# Patient Record
Sex: Female | Born: 1945 | Race: White | Hispanic: No | Marital: Married | Smoking: Former smoker
Health system: Southern US, Community
[De-identification: ages and names within clinical notes are randomized; demographics above are authoritative.]

## PROBLEM LIST (undated history)

## (undated) DIAGNOSIS — I4821 Permanent atrial fibrillation: Secondary | ICD-10-CM

## (undated) DIAGNOSIS — T8209XA Other mechanical complication of heart valve prosthesis, initial encounter: Secondary | ICD-10-CM

## (undated) DIAGNOSIS — Z7901 Long term (current) use of anticoagulants: Secondary | ICD-10-CM

## (undated) DIAGNOSIS — I059 Rheumatic mitral valve disease, unspecified: Secondary | ICD-10-CM

## (undated) DIAGNOSIS — E7800 Pure hypercholesterolemia, unspecified: Secondary | ICD-10-CM

## (undated) DIAGNOSIS — I359 Nonrheumatic aortic valve disorder, unspecified: Secondary | ICD-10-CM

## (undated) DIAGNOSIS — Z954 Presence of other heart-valve replacement: Secondary | ICD-10-CM

## (undated) DIAGNOSIS — F0390 Unspecified dementia without behavioral disturbance: Secondary | ICD-10-CM

## (undated) HISTORY — DX: Pure hypercholesterolemia, unspecified: E78.00

## (undated) HISTORY — DX: Long term (current) use of anticoagulants: Z79.01

## (undated) HISTORY — PX: ABDOMINAL HYSTERECTOMY: SHX81

---

## 1994-08-06 DIAGNOSIS — Z954 Presence of other heart-valve replacement: Secondary | ICD-10-CM

## 1994-08-06 HISTORY — PX: MITRAL VALVE REPLACEMENT: SHX147

## 1994-08-06 HISTORY — PX: AORTIC VALVE REPLACEMENT: SHX41

## 1994-08-06 HISTORY — DX: Presence of other heart-valve replacement: Z95.4

## 2004-04-24 ENCOUNTER — Ambulatory Visit: Payer: Self-pay | Admitting: Internal Medicine

## 2004-05-02 ENCOUNTER — Ambulatory Visit: Payer: Self-pay | Admitting: Cardiology

## 2004-05-16 ENCOUNTER — Ambulatory Visit: Payer: Self-pay | Admitting: Internal Medicine

## 2004-06-13 ENCOUNTER — Ambulatory Visit: Payer: Self-pay | Admitting: Internal Medicine

## 2004-06-27 ENCOUNTER — Ambulatory Visit: Payer: Self-pay | Admitting: Internal Medicine

## 2004-07-25 ENCOUNTER — Ambulatory Visit: Payer: Self-pay | Admitting: *Deleted

## 2004-08-22 ENCOUNTER — Ambulatory Visit: Payer: Self-pay | Admitting: Cardiology

## 2004-09-19 ENCOUNTER — Ambulatory Visit: Payer: Self-pay | Admitting: *Deleted

## 2004-10-17 ENCOUNTER — Ambulatory Visit: Payer: Self-pay | Admitting: Internal Medicine

## 2004-10-27 ENCOUNTER — Ambulatory Visit: Payer: Self-pay | Admitting: *Deleted

## 2004-11-17 ENCOUNTER — Ambulatory Visit: Payer: Self-pay | Admitting: Cardiology

## 2004-12-19 ENCOUNTER — Ambulatory Visit: Payer: Self-pay | Admitting: Cardiology

## 2005-01-16 ENCOUNTER — Ambulatory Visit: Payer: Self-pay | Admitting: Cardiology

## 2005-02-13 ENCOUNTER — Ambulatory Visit: Payer: Self-pay | Admitting: Internal Medicine

## 2005-03-13 ENCOUNTER — Ambulatory Visit: Payer: Self-pay | Admitting: Cardiology

## 2005-04-10 ENCOUNTER — Ambulatory Visit: Payer: Self-pay | Admitting: Internal Medicine

## 2005-04-25 ENCOUNTER — Ambulatory Visit: Payer: Self-pay | Admitting: Cardiology

## 2005-05-16 ENCOUNTER — Ambulatory Visit: Payer: Self-pay | Admitting: Internal Medicine

## 2005-06-13 ENCOUNTER — Ambulatory Visit: Payer: Self-pay | Admitting: Cardiology

## 2005-07-11 ENCOUNTER — Ambulatory Visit: Payer: Self-pay | Admitting: Internal Medicine

## 2005-08-08 ENCOUNTER — Ambulatory Visit: Payer: Self-pay | Admitting: Cardiology

## 2005-09-05 ENCOUNTER — Ambulatory Visit: Payer: Self-pay | Admitting: *Deleted

## 2005-10-03 ENCOUNTER — Ambulatory Visit: Payer: Self-pay | Admitting: Cardiology

## 2005-10-31 ENCOUNTER — Ambulatory Visit: Payer: Self-pay | Admitting: Cardiology

## 2005-12-05 ENCOUNTER — Ambulatory Visit: Payer: Self-pay | Admitting: Internal Medicine

## 2005-12-18 ENCOUNTER — Ambulatory Visit: Payer: Self-pay | Admitting: *Deleted

## 2006-01-09 ENCOUNTER — Ambulatory Visit: Payer: Self-pay | Admitting: Cardiology

## 2006-02-06 ENCOUNTER — Ambulatory Visit: Payer: Self-pay | Admitting: Cardiovascular Disease

## 2006-03-06 ENCOUNTER — Ambulatory Visit: Payer: Self-pay | Admitting: Cardiology

## 2006-04-03 ENCOUNTER — Ambulatory Visit: Payer: Self-pay | Admitting: Cardiology

## 2006-05-01 ENCOUNTER — Ambulatory Visit: Payer: Self-pay | Admitting: Cardiology

## 2006-05-29 ENCOUNTER — Ambulatory Visit: Payer: Self-pay | Admitting: *Deleted

## 2006-07-03 ENCOUNTER — Ambulatory Visit: Payer: Self-pay | Admitting: Cardiology

## 2006-08-07 ENCOUNTER — Ambulatory Visit: Payer: Self-pay | Admitting: Cardiology

## 2006-08-21 ENCOUNTER — Ambulatory Visit: Payer: Self-pay | Admitting: Cardiovascular Disease

## 2006-09-18 ENCOUNTER — Ambulatory Visit: Payer: Self-pay | Admitting: *Deleted

## 2006-09-25 ENCOUNTER — Ambulatory Visit: Payer: Self-pay | Admitting: Internal Medicine

## 2006-10-14 ENCOUNTER — Ambulatory Visit: Payer: Self-pay | Admitting: Internal Medicine

## 2006-11-11 ENCOUNTER — Ambulatory Visit: Payer: Self-pay | Admitting: Cardiology

## 2006-12-09 ENCOUNTER — Ambulatory Visit: Payer: Self-pay | Admitting: Cardiovascular Disease

## 2006-12-23 ENCOUNTER — Ambulatory Visit: Payer: Self-pay | Admitting: Internal Medicine

## 2007-01-06 ENCOUNTER — Ambulatory Visit: Payer: Self-pay | Admitting: Cardiology

## 2007-02-03 ENCOUNTER — Ambulatory Visit: Payer: Self-pay | Admitting: Cardiology

## 2007-03-03 ENCOUNTER — Ambulatory Visit: Payer: Self-pay | Admitting: Internal Medicine

## 2007-04-08 ENCOUNTER — Ambulatory Visit: Payer: Self-pay | Admitting: Cardiovascular Disease

## 2007-05-05 ENCOUNTER — Ambulatory Visit: Payer: Self-pay | Admitting: Internal Medicine

## 2007-05-19 ENCOUNTER — Ambulatory Visit: Payer: Self-pay | Admitting: Cardiology

## 2007-06-16 ENCOUNTER — Ambulatory Visit: Payer: Self-pay | Admitting: Cardiology

## 2007-07-14 ENCOUNTER — Ambulatory Visit: Payer: Self-pay | Admitting: Cardiovascular Disease

## 2007-08-11 ENCOUNTER — Ambulatory Visit: Payer: Self-pay | Admitting: Internal Medicine

## 2007-09-08 ENCOUNTER — Ambulatory Visit: Payer: Self-pay | Admitting: Cardiology

## 2007-10-06 ENCOUNTER — Ambulatory Visit: Payer: Self-pay | Admitting: Cardiology

## 2007-11-03 ENCOUNTER — Ambulatory Visit: Payer: Self-pay | Admitting: Cardiology

## 2007-12-01 ENCOUNTER — Ambulatory Visit: Payer: Self-pay | Admitting: Internal Medicine

## 2007-12-29 ENCOUNTER — Ambulatory Visit: Payer: Self-pay | Admitting: Cardiology

## 2008-01-26 ENCOUNTER — Ambulatory Visit: Payer: Self-pay | Admitting: Cardiology

## 2008-02-23 ENCOUNTER — Ambulatory Visit: Payer: Self-pay | Admitting: Cardiovascular Disease

## 2008-03-22 ENCOUNTER — Ambulatory Visit: Payer: Self-pay | Admitting: Cardiology

## 2008-04-19 ENCOUNTER — Ambulatory Visit: Payer: Self-pay | Admitting: Internal Medicine

## 2008-05-17 ENCOUNTER — Ambulatory Visit: Payer: Self-pay | Admitting: Cardiology

## 2008-06-07 ENCOUNTER — Ambulatory Visit: Payer: Self-pay | Admitting: Cardiology

## 2008-07-05 ENCOUNTER — Ambulatory Visit: Payer: Self-pay | Admitting: Cardiovascular Disease

## 2008-07-05 ENCOUNTER — Ambulatory Visit: Payer: Self-pay | Admitting: Cardiology

## 2008-07-05 LAB — CONVERTED CEMR LAB
ALT: 29 units/L (ref 0–35)
AST: 41 units/L — ABNORMAL HIGH (ref 0–37)
Alkaline Phosphatase: 43 units/L (ref 39–117)
Bilirubin, Direct: 0.1 mg/dL (ref 0.0–0.3)
INR: 4 — ABNORMAL HIGH (ref 0.8–1.0)
Total Bilirubin: 0.5 mg/dL (ref 0.3–1.2)

## 2008-07-09 ENCOUNTER — Ambulatory Visit: Payer: Self-pay | Admitting: Cardiovascular Disease

## 2008-07-09 LAB — CONVERTED CEMR LAB
Basophils Absolute: 0 10*3/uL (ref 0.0–0.1)
Basophils Relative: 0.4 % (ref 0.0–3.0)
Eosinophils Absolute: 0.1 10*3/uL (ref 0.0–0.7)
Eosinophils Relative: 2 % (ref 0.0–5.0)
HCT: 39.7 % (ref 36.0–46.0)
MCHC: 34 g/dL (ref 30.0–36.0)
MCV: 94.1 fL (ref 78.0–100.0)
Monocytes Absolute: 0.6 10*3/uL (ref 0.1–1.0)
Neutrophils Relative %: 64.2 % (ref 43.0–77.0)
Platelets: 129 10*3/uL — ABNORMAL LOW (ref 150–400)
WBC: 6.3 10*3/uL (ref 4.5–10.5)

## 2008-07-26 ENCOUNTER — Ambulatory Visit: Payer: Self-pay | Admitting: Cardiology

## 2008-08-13 ENCOUNTER — Ambulatory Visit: Payer: Self-pay | Admitting: Cardiology

## 2008-09-06 ENCOUNTER — Ambulatory Visit: Payer: Self-pay | Admitting: Internal Medicine

## 2008-10-04 ENCOUNTER — Ambulatory Visit: Payer: Self-pay | Admitting: Internal Medicine

## 2008-10-26 ENCOUNTER — Encounter: Payer: Self-pay | Admitting: *Deleted

## 2008-11-01 ENCOUNTER — Ambulatory Visit: Payer: Self-pay | Admitting: Cardiology

## 2008-11-01 LAB — CONVERTED CEMR LAB: POC INR: 3.6

## 2008-11-22 ENCOUNTER — Ambulatory Visit: Payer: Self-pay | Admitting: Cardiovascular Disease

## 2008-11-22 LAB — CONVERTED CEMR LAB: Prothrombin Time: 21 s

## 2008-12-01 ENCOUNTER — Encounter: Payer: Self-pay | Admitting: *Deleted

## 2008-12-20 ENCOUNTER — Ambulatory Visit: Payer: Self-pay | Admitting: Cardiology

## 2008-12-20 ENCOUNTER — Encounter (INDEPENDENT_AMBULATORY_CARE_PROVIDER_SITE_OTHER): Payer: Self-pay | Admitting: Cardiology

## 2008-12-20 LAB — CONVERTED CEMR LAB: POC INR: 4.3

## 2009-01-04 ENCOUNTER — Encounter: Payer: Self-pay | Admitting: Cardiology

## 2009-01-17 ENCOUNTER — Ambulatory Visit: Payer: Self-pay | Admitting: Cardiology

## 2009-01-17 LAB — CONVERTED CEMR LAB: POC INR: 3.4

## 2009-02-14 ENCOUNTER — Ambulatory Visit: Payer: Self-pay | Admitting: Cardiology

## 2009-02-18 ENCOUNTER — Telehealth: Payer: Self-pay | Admitting: Cardiology

## 2009-03-14 ENCOUNTER — Ambulatory Visit: Payer: Self-pay | Admitting: Internal Medicine

## 2009-03-14 LAB — CONVERTED CEMR LAB: POC INR: 2.7

## 2009-04-11 ENCOUNTER — Ambulatory Visit: Payer: Self-pay | Admitting: Internal Medicine

## 2009-04-11 LAB — CONVERTED CEMR LAB: POC INR: 1.6

## 2009-04-25 ENCOUNTER — Ambulatory Visit: Payer: Self-pay | Admitting: Cardiology

## 2009-04-25 LAB — CONVERTED CEMR LAB: POC INR: 2.7

## 2009-05-25 ENCOUNTER — Ambulatory Visit: Payer: Self-pay | Admitting: Cardiovascular Disease

## 2009-05-31 ENCOUNTER — Encounter: Payer: Self-pay | Admitting: Cardiovascular Disease

## 2009-06-02 ENCOUNTER — Encounter (INDEPENDENT_AMBULATORY_CARE_PROVIDER_SITE_OTHER): Payer: Self-pay | Admitting: *Deleted

## 2009-06-17 ENCOUNTER — Ambulatory Visit: Payer: Self-pay | Admitting: Cardiology

## 2009-06-28 DIAGNOSIS — I4891 Unspecified atrial fibrillation: Secondary | ICD-10-CM

## 2009-06-28 DIAGNOSIS — E7800 Pure hypercholesterolemia, unspecified: Secondary | ICD-10-CM

## 2009-06-29 ENCOUNTER — Ambulatory Visit: Payer: Self-pay | Admitting: Cardiovascular Disease

## 2009-06-29 DIAGNOSIS — I359 Nonrheumatic aortic valve disorder, unspecified: Secondary | ICD-10-CM | POA: Insufficient documentation

## 2009-07-15 ENCOUNTER — Ambulatory Visit: Admission: RE | Admit: 2009-07-15 | Discharge: 2009-07-15 | Payer: Self-pay | Admitting: Cardiovascular Disease

## 2009-07-15 ENCOUNTER — Encounter: Payer: Self-pay | Admitting: Cardiovascular Disease

## 2009-07-15 ENCOUNTER — Ambulatory Visit: Payer: Self-pay | Admitting: Cardiology

## 2009-07-15 ENCOUNTER — Ambulatory Visit: Payer: Self-pay

## 2009-08-12 ENCOUNTER — Ambulatory Visit: Payer: Self-pay

## 2009-09-09 ENCOUNTER — Ambulatory Visit: Payer: Self-pay | Admitting: Internal Medicine

## 2009-10-07 ENCOUNTER — Ambulatory Visit: Payer: Self-pay | Admitting: Cardiovascular Disease

## 2009-10-07 LAB — CONVERTED CEMR LAB: POC INR: 2.7

## 2009-11-04 ENCOUNTER — Ambulatory Visit: Payer: Self-pay | Admitting: Cardiology

## 2009-11-04 LAB — CONVERTED CEMR LAB: POC INR: 1.7

## 2009-11-18 ENCOUNTER — Ambulatory Visit: Payer: Self-pay | Admitting: Cardiology

## 2009-12-02 ENCOUNTER — Ambulatory Visit: Payer: Self-pay | Admitting: Cardiology

## 2009-12-02 LAB — CONVERTED CEMR LAB: POC INR: 2.3

## 2009-12-30 ENCOUNTER — Ambulatory Visit: Payer: Self-pay | Admitting: Cardiovascular Disease

## 2009-12-30 LAB — CONVERTED CEMR LAB: POC INR: 2.1

## 2010-01-20 ENCOUNTER — Ambulatory Visit: Payer: Self-pay | Admitting: Cardiology

## 2010-01-20 LAB — CONVERTED CEMR LAB: POC INR: 2.7

## 2010-02-10 ENCOUNTER — Ambulatory Visit: Payer: Self-pay | Admitting: Cardiology

## 2010-03-10 ENCOUNTER — Ambulatory Visit: Payer: Self-pay | Admitting: Cardiology

## 2010-04-07 ENCOUNTER — Ambulatory Visit: Payer: Self-pay | Admitting: Cardiology

## 2010-05-05 ENCOUNTER — Ambulatory Visit: Payer: Self-pay | Admitting: Internal Medicine

## 2010-06-02 ENCOUNTER — Ambulatory Visit: Admission: RE | Admit: 2010-06-02 | Discharge: 2010-06-02 | Payer: Self-pay | Source: Home / Self Care

## 2010-06-28 NOTE — Miscellaneous (Signed)
Clinical Lists Changes  Observations: Added new observation of ECHOINTERP:  Intrepertation Summary I have carefully compared this study to the study of 02/24/01. LV funtion remains good with an EF of 55-60%. 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 These targets are better on this study due to technique/equipment changes. One of the  targets is seen in the LVOT. This was present in 2002. 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 Also the computed gradients across the aorcti prothesis have increased since 2002. This is actually realted to a velocity in the range of 3.30m/sec now as oppsed to 2.71m/sec before. The images are difficult and this may not be a real change. Therefore after carefull consideration, I am not sure that there are many signifcant changes. However suggest very carefull attention to coumadinizatiuon and repeat echo in 6 months to be sure that there is not signifcant change. The mitral prothesis is workin well.   (03/14/2004 8:45) Added new observation of EKG INTERP: Intrepertation Summary I have carefully compared this study to the study of 02/24/01. LV funtion remains good with an EF of 55-60%. 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 These targets are better on this study due to technique/equipment changes. One of the  targets is seen in the LVOT. This was present in 2002. 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 Also the computed gradients across the aorcti prothesis have increased since 2002. This is actually realted to a velocity in the range of 3.41m/sec now as oppsed to 2.81m/sec before. The images are difficult and this may not be a real change. Therefore after carefull consideration, I am not sure that there are many signifcant changes. However suggest very carefull attention to coumadinizatiuon and repeat echo in 6  months to be sure that there is not signifcant change. The mitral prothesis is workin well.  (03/14/2004 8:42) Added new observation of US CAROTID: Impression No significant plaque or increase in ICA velocites bilaterally. ECA wave forms are normal. Vetebral flow is antegrade bilaterally. 0% ICA stenosis bilaterally (02/24/2001 8:46) Added new observation of ECHOINTERP: Interpreattion Summary A two-dimenaional transthoracic echocardiogram with M-mode and Doppler was perfomed. The EF is 60%. The machanical valves in the aortic and mitral positions are working well. There is no change in the doppler data since 2000. It is my understanding that both valves are St JUDE (02/24/2001 8:44) Added new observation of EKG INTERP: Interpreattion Summary A two-dimenaional transthoracic echocardiogram with M-mode and Doppler was perfomed. The EF is 60%. The machanical valves in the aortic and mitral positions are working well. There is no change in the doppler data since 2000. It is my understanding that both valves are [employer] JUDE (02/24/2001 8:44)      Echocardiogram  Procedure date:  02/24/2001  Findings:      Interpreattion Summary A two-dimenaional transthoracic echocardiogram with M-mode and Doppler was perfomed. The EF is 60%. The machanical valves in the aortic and mitral positions are working well. There is no change in the doppler data since 2000. It is my understanding that both valves are [employer] JUDE  Echocardiogram  Procedure date:  03/14/2004  Findings:       Intrepertation Summary I have carefully compared this study to the study of 02/24/01. LV funtion remains good with an EF of 55-60%. There are targets seen moving in the LV that represents parts  of the patient's original mitral apparatus. These targets are better on this study due to technique/equipment changes. One of the  targets is seen in the LVOT. This was present in 2002. 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 Also the  computed gradients across the aorcti prothesis have increased since 2002. This is actually realted to a velocity in the range of 3.7m/sec now as oppsed to 2.7m/sec before. The images are difficult and this may not be a real change. Therefore after carefull consideration, I am not sure that there are many signifcant changes. However suggest very carefull attention to coumadinizatiuon and repeat echo in 6 months to be sure that there is not signifcant change. The mitral prothesis is workin well.    Carotid Doppler  Procedure date:  02/24/2001  Findings:      Impression No significant plaque or increase in ICA velocites bilaterally. ECA wave forms are normal. Vetebral flow is antegrade bilaterally. 0% ICA stenosis bilaterally

## 2010-06-28 NOTE — Medication Information (Signed)
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 no    Recent/future dental: no  Any missed doses?: no       Is patient compliant with meds? yes       Allergies: No Known Drug Allergies  Anticoagulation Management History:      The patient is taking warfarin and comes in today for a routine follow up visit.  Positive risk factors for bleeding include an age of 66 years or older.  The bleeding index is 'intermediate risk'.  Negative CHADS2 values include Age > 66 years old.  The start date was 09/27/1997.  Her last INR was 4.0 RATIO.  Anticoagulation responsible provider: Tenny Craw MD, Gunnar Fusi.  INR POC: 2.3.  Cuvette Lot#: [id number].  Exp: 09/2010.    Anticoagulation Management Assessment/Plan:      The patient's current anticoagulation dose is Warfarin sodium 5 mg tabs: Use as directed by Anticoagulation Clinic.  The target INR is 2.5 - 3.5.  The next INR is due 10/07/2009.  Anticoagulation instructions were given to patient.  Results were reviewed/authorized by Bethena Midget, RN, BSN.  She was notified by Bethena Midget, RN, BSN.         Prior Anticoagulation Instructions: INR 3.0 Continue 5mg s daily. Recheck in 4 weeks.   Current Anticoagulation Instructions: INR  2.3 Today and Saturday take 7.5mg s then resume 5mg s everyday. Recheck in 4 weeks.  Continue 3 servings per week this is 1/2 cup to 1 cup.

## 2010-06-28 NOTE — Medication Information (Signed)
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 no    Recent/future dental: no  Any missed doses?: no       Is patient compliant with meds? yes       Allergies: No Known Drug Allergies  Anticoagulation Management History:      The patient is taking warfarin and comes in today for a routine follow up visit.  Negative risk factors for bleeding include an age less than 69 years old.  The bleeding index is 'low risk'.  Negative CHADS2 values include Age > 57 years old.  The start date was 09/27/1997.  Her last INR was 4.0 RATIO.  Anticoagulation responsible provider: Daleen Squibb MD, Maisie Fus.  INR POC: 3.3.  Cuvette Lot#: [id number].  Exp: 06/2010.    Anticoagulation Management Assessment/Plan:      The patient's current anticoagulation dose is Warfarin sodium 5 mg tabs: Use as directed by Anticoagulation Clinic.  The target INR is 2.5 - 3.5.  The next INR is due 07/15/2009.  Anticoagulation instructions were given to patient.  Results were reviewed/authorized by Shelby Dubin, PharmD, BCPS, CPP.  She was notified by Ysidro Evert, Pharm D Candidate.         Prior Anticoagulation Instructions: INR 2.8 CONTINUE TAKING 1 TABLET EVERYDAY.  Current Anticoagulation Instructions: INR: 3.3 Continue with same dosage of 5mg  tablet daily Recheck in 4 weeks

## 2010-06-28 NOTE — Medication Information (Signed)
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 no    Recent/future dental: no  Any missed doses?: no       Is patient compliant with meds? yes       Current Medications (verified): 1)  Warfarin Sodium 5 Mg Tabs (Warfarin Sodium) .... Use As Directed By Anticoagulation Clinic 2)  Lanoxin 0.25 Mg Tabs (Digoxin) .... Take 1/2 Tab Every Day 3)  Pravastatin Sodium 20 Mg Tabs (Pravastatin Sodium) .... Take 1 Tab Every Evening 4)  Aspirin 81 Mg Tbec (Aspirin) .... Take One Tablet By Mouth Daily 5)  Vitamin C 1000 Mg Tabs (Ascorbic Acid) .Marland Kitchen.. 1 Tab By Mouth Once Daily 6)  Garlic   Powd (Garlic) .... Daily 7)  B Complex  Tabs (B Complex Vitamins) .... Tab By Mouth Once Daily 8)  Calcium 500 Mg Tabs (Calcium) .Marland Kitchen.. 1 Tab By Mouth Once Daily  Allergies (verified): No Known Drug Allergies  Anticoagulation Management History:      The patient is taking warfarin and comes in today for a routine follow up visit.  Positive risk factors for bleeding include an age of 67 years or older.  The bleeding index is 'intermediate risk'.  Negative CHADS2 values include Age > 4 years old.  The start date was 09/27/1997.  Her last INR was 4.0 RATIO.  Anticoagulation responsible provider: Riley Kill MD, Maisie Fus.  INR POC: 2.7.  Cuvette Lot#: [id number].  Exp: 03/2011.    Anticoagulation Management Assessment/Plan:      The patient's current anticoagulation dose is Warfarin sodium 5 mg tabs: Use as directed by Anticoagulation Clinic.  The target INR is 2.5 -  3.5.  The next INR is due 04/07/2010.  Anticoagulation instructions were given to patient.  Results were reviewed/authorized by Reina Fuse, PharmD.  She was notified by Reina Fuse PharmD.         Prior Anticoagulation Instructions: INR 3.2  Continue taking one tablet every day except one and one-half tablet on Monday and Friday.  Return to clinic in four weeks.   Current Anticoagulation Instructions: INR 2.7  Continue taking Coumadin 5 mg (1 tab) on all days except Coumadin 7.5 mg (1.5 tabs) on Mondays and Fridays. Return to clinic in 4 weeks.

## 2010-06-28 NOTE — Medication Information (Signed)
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 no    Recent/future dental: no  Any missed doses?: no       Is patient compliant with meds? yes       Allergies: No Known Drug Allergies  Anticoagulation Management History:      The patient is taking warfarin and comes in today for a routine follow up visit.  Positive risk factors for bleeding include an age of 66 years or older.  The bleeding index is 'intermediate risk'.  Negative CHADS2 values include Age > 32 years old.  The start date was 09/27/1997.  Her last INR was 4.0 RATIO.  Anticoagulation responsible provider: Myrtis Ser MD, Tinnie Gens.  INR POC: 3.2.  Cuvette Lot#: [id number].  Exp: 03/2011.    Anticoagulation Management Assessment/Plan:      The patient's current anticoagulation dose is Warfarin sodium 5 mg tabs: Use as directed by Anticoagulation Clinic.  The target INR is 2.5 - 3.5.  The next INR is due 03/10/2010.  Anticoagulation instructions were given to patient.  Results were reviewed/authorized by Weston Brass, PharmD.  She was notified by Kennieth Francois.         Prior Anticoagulation Instructions: INR 2.7  Continue taking Coumadin 1 tab (5 mg) on all days except Coumadin 1.5 tabs (7.5 mg) on Mondays and Fridays. Return to clinic in 3 weeks.   Current Anticoagulation Instructions: INR 3.2  Continue taking one tablet every day except one and one-half tablet on Monday and Friday.  Return to clinic in four weeks.

## 2010-06-28 NOTE — Letter (Signed)
Summary: Appointment - Reminder 2  [facility], Main Office  [address]   [place], [place] [address]   Phone: [phone]  Fax: [phone]     June 02, 2009 MRN: [id number]   [facility] [address] [place], [place]  [address]   Dear Ms. Handley,  Our records indicate that it is time to schedule a follow-up appointment with Dr. Eden Emms. It is very important that we reach you to schedule this appointment. We look forward to participating in your health care needs. Please contact us at the number listed above at your earliest convenience to schedule your appointment.  If you are unable to make an appointment at this time, give [place] a call so we can update our records.   Sincerely,   [facility] Scheduling Team

## 2010-06-28 NOTE — Medication Information (Signed)
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 no    Recent/future dental: no  Any missed doses?: no       Is patient compliant with meds? yes       Allergies: No Known Drug Allergies  Anticoagulation Management History:      The patient is taking warfarin and comes in today for a routine follow up visit.  Positive risk factors for bleeding include an age of 44 years or older.  The bleeding index is 'intermediate risk'.  Negative CHADS2 values include Age > 79 years old.  The start date was 09/27/1997.  Her last INR was 4.0 RATIO.  Anticoagulation responsible provider: Excell Seltzer MD, Casimiro Needle.  INR POC: 2.1.  Cuvette Lot#: [id number].  Exp: 02/2011.    Anticoagulation Management Assessment/Plan:      The patient's current anticoagulation dose is Warfarin sodium 5 mg tabs: Use as directed by Anticoagulation Clinic.  The target INR is 2.5 - 3.5.  The next INR is due 01/20/2010.  Anticoagulation instructions were given to patient.  Results were reviewed/authorized by Cloyde Reams, RN, BSN.  She was notified by Cloyde Reams RN.         Prior Anticoagulation Instructions: INR 2.3  Continue same regimen of 1.5 tabs on Monday and 1 tab all other days of the week.  Recheck INR in 4 weeks.  Current Anticoagulation Instructions: INR 2.1  Start taking 1 tablet daily except 1.5 tablets on Mondays and Fridays.  Recheck in 3 weeks.

## 2010-06-28 NOTE — Medication Information (Signed)
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 no    Recent/future dental: no  Any missed doses?: no       Is patient compliant with meds? yes       Allergies: No Known Drug Allergies  Anticoagulation Management History:      The patient is taking warfarin and comes in today for a routine follow up visit.  Positive risk factors for bleeding include an age of 66 years or older.  The bleeding index is 'intermediate risk'.  Negative CHADS2 values include Age > 61 years old.  The start date was 09/27/1997.  Her last INR was 4.0 RATIO.  Anticoagulation responsible Kodi Guerrera: Allred MD, Fayrene Fearing.  INR POC: 2.8.  Cuvette Lot#: [id number].  Exp: 06/2011.    Anticoagulation Management Assessment/Plan:      The patient's current anticoagulation dose is Warfarin sodium 5 mg tabs: Use as directed by Anticoagulation Clinic.  The target INR is 2.5 - 3.5.  The next INR is due 06/02/2010.  Anticoagulation instructions were given to patient.  Results were reviewed/authorized by Weston Brass, PharmD.  She was notified by Weston Brass PharmD.         Prior Anticoagulation Instructions: INR 3.5  Continue taking Coumadin 1 tab (5 mg) on all days except for Coumadin 1.5 tabs (7.5 mg) on Mondays and Fridays. Return to clinic in 4 weeks.   Current Anticoagulation Instructions: INR 2.8  Continue same dose of 1 tablet every day except 1 1/2 tablets on Monday and Friday.   Recheck INR in 4 weeks.

## 2010-06-28 NOTE — Medication Information (Signed)
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 no    Recent/future dental: no  Any missed doses?: no       Is patient compliant with meds? yes       Allergies: No Known Drug Allergies  Anticoagulation Management History:      The patient is taking warfarin and comes in today for a routine follow up visit.  Positive risk factors for bleeding include an age of 66 years or older.  The bleeding index is 'intermediate risk'.  Negative CHADS2 values include Age > 66 years old.  The start date was 09/27/1997.  Her last INR was 4.0 RATIO.  Anticoagulation responsible provider: Eden Emms MD, Theron Arista.  INR POC: 2.7.  Cuvette Lot#: [id number].  Exp: 12/2010.    Anticoagulation Management Assessment/Plan:      The patient's current anticoagulation dose is Warfarin sodium 5 mg tabs: Use as directed by Anticoagulation Clinic.  The target INR is 2.5 - 3.5.  The next INR is due 11/04/2009.  Anticoagulation instructions were given to patient.  Results were reviewed/authorized by Weston Brass, PharmD.  She was notified by Weston Brass PharmD.         Prior Anticoagulation Instructions: INR  2.3 Today and Saturday take 7.5mg s then resume 5mg s everyday. Recheck in 4 weeks.  Continue 3 servings per week this is 1/2 cup to 1 cup.   Current Anticoagulation Instructions: INR 2.7  Continue same dose of 1 tablet every day

## 2010-06-28 NOTE — Medication Information (Signed)
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 no    Recent/future dental: no  Any missed doses?: no       Is patient compliant with meds? yes       Allergies: No Known Drug Allergies  Anticoagulation Management History:      The patient is taking warfarin and comes in today for a routine follow up visit.  Positive risk factors for bleeding include an age of 66 years or older.  The bleeding index is 'intermediate risk'.  Negative CHADS2 values include Age > 48 years old.  The start date was 09/27/1997.  Her last INR was 4.0 RATIO.  Anticoagulation responsible provider: Myrtis Ser MD, Tinnie Gens.  INR POC: 1.7.  Cuvette Lot#: [id number].  Exp: 12/2010.    Anticoagulation Management Assessment/Plan:      The patient's current anticoagulation dose is Warfarin sodium 5 mg tabs: Use as directed by Anticoagulation Clinic.  The target INR is 2.5 - 3.5.  The next INR is due 11/18/2009.  Anticoagulation instructions were given to patient.  Results were reviewed/authorized by Elaina Pattee, PharmD.  She was notified by Elaina Pattee, PharmD.         Prior Anticoagulation Instructions: INR 2.7  Continue same dose of 1 tablet every day   Current Anticoagulation Instructions: INR 1.7. Take 1.5 tablets today and tomorrow, then take 1 tablet daily. Recheck in 2 weeks.

## 2010-06-28 NOTE — Medication Information (Signed)
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 no    Recent/future dental: no  Any missed doses?: no       Is patient compliant with meds? yes       Allergies: No Known Drug Allergies  Anticoagulation Management History:      The patient is taking warfarin and comes in today for a routine follow up visit.  Positive risk factors for bleeding include an age of 66 years or older.  The bleeding index is 'intermediate risk'.  Negative CHADS2 values include Age > 12 years old.  The start date was 09/27/1997.  Her last INR was 4.0 RATIO.  Anticoagulation responsible provider: Jens Som MD, Arlys John.  INR POC: 3.4.  Cuvette Lot#: [id number].  Exp: 08/2010.    Anticoagulation Management Assessment/Plan:      The patient's current anticoagulation dose is Warfarin sodium 5 mg tabs: Use as directed by Anticoagulation Clinic.  The target INR is 2.5 - 3.5.  The next INR is due 08/12/2009.  Anticoagulation instructions were given to patient.  Results were reviewed/authorized by Eda Keys, PharmD.  She was notified by Eda Keys.         Prior Anticoagulation Instructions: INR: 3.3 Continue with same dosage of 5mg  tablet daily Recheck in 4 weeks  Current Anticoagulation Instructions: INR 3.4  Continue current dosing schedule.  Take 1 tablet daily.  Return to clinic in 4 weeks.

## 2010-06-28 NOTE — Medication Information (Signed)
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 no    Recent/future dental: no  Any missed doses?: no       Is patient compliant with meds? yes       Allergies (verified): No Known Drug Allergies  Anticoagulation Management History:      The patient is taking warfarin and comes in today for a routine follow up visit.  Positive risk factors for bleeding include an age of 66 years or older.  The bleeding index is 'intermediate risk'.  Negative CHADS2 values include Age > 66 years old.  The start date was 09/27/1997.  Her last INR was 4.0 RATIO.  Anticoagulation responsible provider: Clifton James MD, Cristal Deer.  INR POC: 3.0.  Cuvette Lot#: [id number].  Exp: 09/2010.    Anticoagulation Management Assessment/Plan:      The patient's current anticoagulation dose is Warfarin sodium 5 mg tabs: Use as directed by Anticoagulation Clinic.  The target INR is 2.5 - 3.5.  The next INR is due 09/09/2009.  Anticoagulation instructions were given to patient.  Results were reviewed/authorized by Bethena Midget, RN, BSN.  She was notified by Bethena Midget, RN, BSN.         Prior Anticoagulation Instructions: INR 3.4  Continue current dosing schedule.  Take 1 tablet daily.  Return to clinic in 4 weeks.   Current Anticoagulation Instructions: INR 3.0 Continue 5mg s daily. Recheck in 4 weeks.

## 2010-06-28 NOTE — Medication Information (Signed)
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 no    Recent/future dental: no  Any missed doses?: no       Is patient compliant with meds? yes       Allergies: No Known Drug Allergies  Anticoagulation Management History:      The patient is taking warfarin and comes in today for a routine follow up visit.  Positive risk factors for bleeding include an age of 66 years or older.  The bleeding index is 'intermediate risk'.  Negative CHADS2 values include Age > 64 years old.  The start date was 09/27/1997.  Her last INR was 4.0 RATIO.  Anticoagulation responsible provider: Riley Kill MD, Maisie Fus.  INR POC: 1.6.  Exp: 12/2010.    Anticoagulation Management Assessment/Plan:      The patient's current anticoagulation dose is Warfarin sodium 5 mg tabs: Use as directed by Anticoagulation Clinic.  The target INR is 2.5 - 3.5.  The next INR is due 12/02/2009.  Anticoagulation instructions were given to patient.  Results were reviewed/authorized by Cloyde Reams, RN, BSN.  She was notified by Cloyde Reams RN.         Prior Anticoagulation Instructions: INR 1.7. Take 1.5 tablets today and tomorrow, then take 1 tablet daily. Recheck in 2 weeks.   Current Anticoagulation Instructions: INR 1.6  Take 2 tablets today, then start taking 1 tablet daily except 1.5 tablets on Mondays.  Recheck in 2 weeks.

## 2010-06-28 NOTE — Medication Information (Signed)
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 no    Recent/future dental: no  Any missed doses?: no       Is patient compliant with meds? yes       Allergies: No Known Drug Allergies  Anticoagulation Management History:      The patient is taking warfarin and comes in today for a routine follow up visit.  Positive risk factors for bleeding include an age of 66 years or older.  The bleeding index is 'intermediate risk'.  Negative CHADS2 values include Age > 19 years old.  The start date was 09/27/1997.  Her last INR was 4.0 RATIO.  Anticoagulation responsible provider: Myrtis Ser MD, Tinnie Gens.  INR POC: 3.5.  Cuvette Lot#: [id number].  Exp: 03/2011.    Anticoagulation Management Assessment/Plan:      The patient's current anticoagulation dose is Warfarin sodium 5 mg tabs: Use as directed by Anticoagulation Clinic.  The target INR is 2.5 - 3.5.  The next INR is due 05/05/2010.  Anticoagulation instructions were given to patient.  Results were reviewed/authorized by Reina Fuse, PharmD.  She was notified by Reina Fuse PharmD.         Prior Anticoagulation Instructions: INR 2.7  Continue taking Coumadin 5 mg (1 tab) on all days except Coumadin 7.5 mg (1.5 tabs) on Mondays and Fridays. Return to clinic in 4 weeks.   Current Anticoagulation Instructions: INR 3.5  Continue taking Coumadin 1 tab (5 mg) on all days except for Coumadin 1.5 tabs (7.5 mg) on Mondays and Fridays. Return to clinic in 4 weeks.

## 2010-06-28 NOTE — Medication Information (Signed)
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 no    Recent/future dental: no  Any missed doses?: no       Is patient compliant with meds? yes       Allergies: No Known Drug Allergies  Anticoagulation Management History:      The patient is taking warfarin and comes in today for a routine follow up visit.  Positive risk factors for bleeding include an age of 65 years or older.  The bleeding index is 'intermediate risk'.  Negative CHADS2 values include Age > 39 years old.  The start date was 09/27/1997.  Her last INR was 4.0 RATIO.  Anticoagulation responsible provider: Eden Emms MD, Theron Arista.  INR POC: 2.3.  Cuvette Lot#: [id number].  Exp: 01/2011.    Anticoagulation Management Assessment/Plan:      The patient's current anticoagulation dose is Warfarin sodium 5 mg tabs: Use as directed by Anticoagulation Clinic.  The target INR is 2.5 - 3.5.  The next INR is due 12/30/2009.  Anticoagulation instructions were given to patient.  Results were reviewed/authorized by Weston Brass, PharmD.  She was notified by Dillard Cannon.         Prior Anticoagulation Instructions: INR 1.6  Take 2 tablets today, then start taking 1 tablet daily except 1.5 tablets on Mondays.  Recheck in 2 weeks.    Current Anticoagulation Instructions: INR 2.3  Continue same regimen of 1.5 tabs on Monday and 1 tab all other days of the week.  Recheck INR in 4 weeks.

## 2010-06-28 NOTE — Assessment & Plan Note (Signed)
Summary: per check out/sf  Medications Added VITAMIN C 1000 MG TABS (ASCORBIC ACID) 1 tab by mouth once daily GARLIC   POWD (GARLIC) daily B COMPLEX  TABS (B COMPLEX VITAMINS) tab by mouth once daily CALCIUM 500 MG TABS (CALCIUM) 1 tab by mouth once daily      Allergies Added: NKDA  History of Present Illness: Tammy Walter is seen today for F/U of AVR,MVR and elevated lipids.  She does not have outpatient insurance and we have kept testing to a minimum.  I reviewed her coumadin chart and she has been Rx with no bleeding diathesis.  She does have medicare hospital coverage.  She denies TIA, bleeding, palpitatoins, SSCP or dyspnea.  I believe she has 2 [employer] valves placed in 94 by Dr Arvilla Market in [facility].  She has no documented CAD.  She has been compliant with her meds As expected with her long standing valve disease she has chronic afib with good rate control  Current Problems (verified): 1)  Aortic Valve Disorders  (ICD-424.1) 2)  Atrial Fibrillation  (ICD-427.31) 3)  Hypercholesterolemia  (ICD-272.0) 4)  Coumadin Therapy  (ICD-V58.61)  Current Medications (verified): 1)  Warfarin Sodium 5 Mg Tabs (Warfarin Sodium) .... Use As Directed By Anticoagulation Clinic 2)  Lanoxin 0.25 Mg Tabs (Digoxin) .... Take 1/2 Tab Every Day 3)  Pravastatin Sodium 20 Mg Tabs (Pravastatin Sodium) .... Take 1 Tab Every Evening 4)  Aspirin 81 Mg Tbec (Aspirin) .... Take One Tablet By Mouth Daily 5)  Vitamin C 1000 Mg Tabs (Ascorbic Acid) .Marland Kitchen.. 1 Tab By Mouth Once Daily 6)  Garlic   Powd (Garlic) .... Daily 7)  B Complex  Tabs (B Complex Vitamins) .... Tab By Mouth Once Daily 8)  Calcium 500 Mg Tabs (Calcium) .Marland Kitchen.. 1 Tab By Mouth Once Daily  Allergies (verified): No Known Drug Allergies  Review of Systems       Denies fever, malais, weight loss, blurry vision, decreased visual acuity, cough, sputum, SOB, hemoptysis, pleuritic pain, palpitaitons, heartburn, abdominal pain, melena, lower extremity edema,  claudication, or rash. All other systems reviewed and negative  Vital Signs:  Patient profile:   65 year old female Height:      66 inches Weight:      160 pounds BMI:     25.92 Pulse rate:   70 / minute Resp:     12 per minute BP sitting:   118 / 80  (left arm)  Vitals Entered By: Kem Parkinson (June 29, 2009 9:25 AM)  Physical Exam  General:  Affect appropriate Healthy:  appears stated age HEENT: normal Neck supple with no adenopathy JVP normal no bruits no thyromegaly Lungs clear with no wheezing and good diaphragmatic motion Heart:  S1/S2clic SEM and ? apical MR murmur no rub, gallop or click PMI normal Abdomen: benighn, BS positve, no tenderness, no AAA no bruit.  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  Echo Her updated medication list for this problem includes:    Lanoxin 0.25 Mg Tabs (Digoxin) .Marland Kitchen... Take 1/2 tab every day  Orders: Echocardiogram (Echo)  Problem # 2:  ATRIAL FIBRILLATION (ICD-427.31) Good rate control and anticoagulatin Her updated medication list for this problem includes:    Warfarin Sodium 5 Mg Tabs (Warfarin sodium) ..... Use as directed  by anticoagulation clinic    Lanoxin 0.25 Mg Tabs (Digoxin) .Marland Kitchen... Take 1/2 tab every day    Aspirin 81 Mg Tbec (Aspirin) .Marland Kitchen... Take one tablet by mouth daily  Problem # 3:  HYPERCHOLESTEROLEMIA (ICD-272.0) Labs per primary in [facility] Her updated medication list for this problem includes:    Pravastatin Sodium 20 Mg Tabs (Pravastatin sodium) .Marland Kitchen... Take 1 tab every evening  Problem # 4:  COUMADIN THERAPY (ICD-V58.61) Followed by [facility].  Patient indicates occasional values of 1.8 but in general Rx  Patient Instructions: 1)  Your physician recommends that you schedule a follow-up appointment  in: 12 months 2)  Your physician has requested that you have an echocardiogram.  Echocardiography is a painless test that uses sound waves to create images of your heart. It provides your doctor with information about the size and shape of your heart and how well your heart's chambers and valves are working.  This procedure takes approximately one hour. There are no restrictions for this procedure.   EKG Report  Procedure date:  06/29/2009  Findings:      Afib 68 LAD Abnormal ECG

## 2010-06-28 NOTE — Medication Information (Signed)
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 no    Recent/future dental: no  Any missed doses?: no       Is patient compliant with meds? yes       Allergies: No Known Drug Allergies  Anticoagulation Management History:      The patient is taking warfarin and comes in today for a routine follow up visit.  Positive risk factors for bleeding include an age of 8 years or older.  The bleeding index is 'intermediate risk'.  Negative CHADS2 values include Age > 84 years old.  The start date was 09/27/1997.  Her last INR was 4.0 RATIO.  Anticoagulation responsible provider: Antoine Poche MD, Fayrene Fearing.  INR POC: 2.7.  Cuvette Lot#: [id number].  Exp: 02/2011.    Anticoagulation Management Assessment/Plan:      The patient's current anticoagulation dose is Warfarin sodium 5 mg tabs: Use as directed by Anticoagulation Clinic.  The target INR is 2.5 - 3.5.  The next INR is due 02/10/2010.  Anticoagulation instructions were given to patient.  Results were reviewed/authorized by Reina Fuse, PharmD.  She was notified by Reina Fuse, PharmD.         Prior Anticoagulation Instructions: INR 2.1  Start taking 1 tablet daily except 1.5 tablets on Mondays and Fridays.  Recheck in 3 weeks.    Current Anticoagulation Instructions: INR 2.7  Continue taking Coumadin 1 tab (5 mg) on all days except Coumadin 1.5 tabs (7.5 mg) on Mondays and Fridays. Return to clinic in 3 weeks.

## 2010-06-29 NOTE — Medication Information (Signed)
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 no    Recent/future dental: no  Any missed doses?: no       Is patient compliant with meds? yes       Allergies: No Known Drug Allergies  Anticoagulation Management History:      The patient is taking warfarin and comes in today for a routine follow up visit.  Positive risk factors for bleeding include an age of 66 years or older.  The bleeding index is 'intermediate risk'.  Negative CHADS2 values include Age > 66 years old.  The start date was 09/27/1997.  Her last INR was 4.0 RATIO.  Anticoagulation responsible provider: Tenny Craw MD, Gunnar Fusi.  INR POC: 3.0.  Cuvette Lot#: [id number].  Exp: 12/2011.    Anticoagulation Management Assessment/Plan:      The patient's current anticoagulation dose is Warfarin sodium 5 mg tabs: Use as directed by Anticoagulation Clinic.  The target INR is 2.5 - 3.5.  The next INR is due 06/30/2010.  Anticoagulation instructions were given to patient.  Results were reviewed/authorized by Bethena Midget, RN, BSN.  She was notified by Bethena Midget, RN, BSN.         Prior Anticoagulation Instructions: INR 2.8  Continue same dose of 1 tablet every day except 1 1/2 tablets on Monday and Friday.   Recheck INR in 4 weeks.   Current Anticoagulation Instructions: INR 3.0 Continue 1 pill everyday except 1.5 pills on Mondays and Fridays. Recheck in 4 weeks.

## 2010-06-30 ENCOUNTER — Encounter (INDEPENDENT_AMBULATORY_CARE_PROVIDER_SITE_OTHER): Payer: Self-pay

## 2010-06-30 ENCOUNTER — Ambulatory Visit: Admit: 2010-06-30 | Payer: Self-pay

## 2010-06-30 ENCOUNTER — Encounter: Payer: Self-pay | Admitting: Cardiology

## 2010-06-30 DIAGNOSIS — Z7901 Long term (current) use of anticoagulants: Secondary | ICD-10-CM

## 2010-06-30 DIAGNOSIS — I4891 Unspecified atrial fibrillation: Secondary | ICD-10-CM

## 2010-06-30 LAB — CONVERTED CEMR LAB: POC INR: 3

## 2010-07-05 NOTE — Medication Information (Signed)
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 no    Recent/future dental: no  Any missed doses?: no       Is patient compliant with meds? yes       Allergies: No Known Drug Allergies  Anticoagulation Management History:      The patient is taking warfarin and comes in today for a routine follow up visit.  Positive risk factors for bleeding include an age of 66 years or older.  The bleeding index is 'intermediate risk'.  Negative CHADS2 values include Age > 33 years old.  The start date was 09/27/1997.  Her last INR was 4.0 RATIO.  Anticoagulation responsible provider: Jens Som MD, Arlys John.  INR POC: 3.0.  Cuvette Lot#: [id number].  Exp: 05/2011.    Anticoagulation Management Assessment/Plan:      The patient's current anticoagulation dose is Warfarin sodium 5 mg tabs: Use as directed by Anticoagulation Clinic.  The target INR is 2.5 - 3.5.  The next INR is due 07/28/2010.  Anticoagulation instructions were given to patient.  Results were reviewed/authorized by Weston Brass, PharmD.  She was notified by Margot Chimes PharmD Candidate.         Prior Anticoagulation Instructions: INR 3.0 Continue 1 pill everyday except 1.5 pills on Mondays and Fridays. Recheck in 4 weeks.   Current Anticoagulation Instructions: INR 3.0  Continue current coumadin regimen of 1 tablet everyday except 1 1/2 tablets on Monday and Friday.

## 2010-07-11 DIAGNOSIS — I359 Nonrheumatic aortic valve disorder, unspecified: Secondary | ICD-10-CM

## 2010-07-11 DIAGNOSIS — I4891 Unspecified atrial fibrillation: Secondary | ICD-10-CM

## 2010-07-28 ENCOUNTER — Encounter (INDEPENDENT_AMBULATORY_CARE_PROVIDER_SITE_OTHER): Payer: Self-pay

## 2010-07-28 ENCOUNTER — Encounter: Payer: Self-pay | Admitting: Cardiovascular Disease

## 2010-07-28 DIAGNOSIS — Z7901 Long term (current) use of anticoagulants: Secondary | ICD-10-CM

## 2010-07-28 DIAGNOSIS — I4891 Unspecified atrial fibrillation: Secondary | ICD-10-CM

## 2010-07-28 DIAGNOSIS — I359 Nonrheumatic aortic valve disorder, unspecified: Secondary | ICD-10-CM

## 2010-08-03 NOTE — Medication Information (Signed)
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 no    Recent/future dental: no  Any missed doses?: no       Is patient compliant with meds? yes       Allergies: No Known Drug Allergies  Anticoagulation Management History:      Positive risk factors for bleeding include an age of 66 years or older.  The bleeding index is 'intermediate risk'.  Negative CHADS2 values include Age > 66 years old.  The start date was 09/27/1997.  Her last INR was 4.0 RATIO.  Anticoagulation responsible provider: Clifton James MD, Cristal Deer.  Cuvette Lot#: [id number].  Exp: 05/2011.    Anticoagulation Management Assessment/Plan:      The patient's current anticoagulation dose is Warfarin sodium 5 mg tabs: Use as directed by Anticoagulation Clinic.  The target INR is 2.5 - 3.5.  The next INR is due 08/25/2010.  Anticoagulation instructions were given to patient.  Results were reviewed/authorized by Bayard Hugger, PharmD.         Prior Anticoagulation Instructions: INR 3.0  Continue current coumadin regimen of 1 tablet everyday except 1 1/2 tablets on Monday and Friday.  Current Anticoagulation Instructions: INR 3.4 at goal continue current dose: 1 tablet (5mg ) every day, except Monday and Friday take 1.5 tablet (7.5mg ) recheck INR in 4 weeks.

## 2010-08-25 ENCOUNTER — Ambulatory Visit (INDEPENDENT_AMBULATORY_CARE_PROVIDER_SITE_OTHER): Payer: Self-pay | Admitting: *Deleted

## 2010-08-25 DIAGNOSIS — I4891 Unspecified atrial fibrillation: Secondary | ICD-10-CM

## 2010-08-25 DIAGNOSIS — Z7901 Long term (current) use of anticoagulants: Secondary | ICD-10-CM | POA: Insufficient documentation

## 2010-08-25 DIAGNOSIS — I359 Nonrheumatic aortic valve disorder, unspecified: Secondary | ICD-10-CM

## 2010-08-25 LAB — POCT INR: INR: 2.9

## 2010-09-05 ENCOUNTER — Encounter: Payer: Self-pay | Admitting: Cardiovascular Disease

## 2010-09-22 ENCOUNTER — Ambulatory Visit (INDEPENDENT_AMBULATORY_CARE_PROVIDER_SITE_OTHER): Payer: Self-pay | Admitting: *Deleted

## 2010-09-22 DIAGNOSIS — I359 Nonrheumatic aortic valve disorder, unspecified: Secondary | ICD-10-CM

## 2010-09-22 DIAGNOSIS — I4891 Unspecified atrial fibrillation: Secondary | ICD-10-CM

## 2010-09-22 LAB — POCT INR: INR: 2.7

## 2010-10-10 NOTE — Assessment & Plan Note (Signed)
[facility]                            CARDIOLOGY OFFICE NOTE   NAME:Bandel, KAZOUA GOSSEN                       MRN:          [id number]  DATE:04/08/2007                            DOB:          [birth year]    Ms. Teasdale is a new patient to me.  She has previously been seen by Dr.  Glennon Hamilton.   She is an extremely delightful lady who had aortic and mitral valve  replacement by Dr. Arvilla Market in [facility].  As far as I can tell this was  done quite some time ago, possibly 14-15 years ago.  I will have to  review her chart to see.  She has done well.  She sees our Coumadin  clinic on a regular basis.  She does not have insurance and has not had  an echocardiogram since October of 2005.  At that time, there was a  question of a mass in the left ventricle.  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   Her aortic and mitral valves were functionally normal back then.  However she did have a mean gradient of 26 across her aortic valve, a  mean gradient of 3 across her mitral valve, without significant  periprosthetic leaks.   In talking to [facility], she has been active.  She has some arthritis.  She  talks her [place] on a regular basis.  She has not had  any palpitations, PND, or orthopnea, no syncope, no TIA or CVA.  She has  been compliant with her medications.   Her Digoxin dose is 0.125 a day.  Her warfarin is 5 mg tablets as  directed.  Review of systems otherwise negative.   EXAM:  Remarkable for being in atrial fibrillation at a rate of 80.  Blood pressure is 130/70, afebrile, respiratory rate 14.  HEENT:  Unremarkable.  NECK:  Carotids normal without bruit, no lymphadenopathy, thyromegaly,  or JVP elevation.  LUNGS:  Clear with good diaphragmatic motion, no wheezing.  CARDIAC:  S1, S2 clicks with a systolic ejection murmur, no diastolic  murmurs.  PMI normal, status post sternotomy.  ABDOMEN:  Bowel sounds normal, no tenderness, no bruit, no  hepatosplenomegaly, or hepatojugular reflux.  No triple-A, no bruit.  EXTREMITIES:  Distal pulses are intact, no edema.  NEURO:  Non focal, no muscular weakness.  SKIN:  Warm and dry.   EKG was done at her primary care physician.  We got a copy of it.  It  shows AFib and nonspecific ST-T wave changes and dig effect.   IMPRESSION:  1. Status post aortic and mitral valve replacements many years ago.      The patient does not have the money to pay for an echocardiogram.      She is asymptomatic.  Despite having fairly significant systolic      outflow murmur I think that we can forego an echo at this time.  If      she has any symptoms we can certainly do one in the office and  not      charge her.  She will continue SV prophylaxis.  2. Chronic AFib.  Continue Dig.  Rate control seems adequate.  She is      active, without palpitations.  3. Borderline hypertension.  The patient to monitor her blood pressure      at home, continue low salt diet.  I think that it would be      reasonable to add low-dose ACE inhibitor or beta blocker should her      systolics run greater than 140-150.  I will see her back in 6      months and we will reassess this.   It was delightful to meet [facility] today.  She is a very pleasant lady and  fortunately her valves have held up well over the years.     Noralyn Pick. Eden Emms, MD, [facility]  Electronically Signed    PCN/MedQ  DD: 04/08/2007  DT: 04/08/2007  Job #: [id number]

## 2010-10-10 NOTE — Assessment & Plan Note (Signed)
[facility]                            CARDIOLOGY OFFICE NOTE   NAME:Tammy Walter                       MRN:          [id number]  DATE:07/05/2008                            DOB:          [birth year]    Tammy Walter returns today for a followup.  She is a previous patient of Dr.  Graceann Walter.  Unfortunately, she does not have insurance and we have  tried to keep her testing to a minimal.  She has a distant history of  aortic and mitral valve replacements over 15 years ago.  I believe they  were done by Dr. Mayford Walter in [facility].  Her last echo was back in  2005, at which time she did have a mean aortic gradient of 26.   However, she has not had any significant regurgitation or perivalvular  leak.  She is asymptomatic.  She is at the [facility] today and had  an INR of 6.  Due to internal quality measures, I would like to have  this repeated as we have had some spurious results lately.  She is not  bleeding from anywhere.  She has not had any antibiotics.  She has not  had any significant liver problems in the past.  She has been compliant  with her medicines and taking the way she normally does.   In talking to her, she is active.  She walks her [place] shepherd, Tammy Walter  on a regular basis.  There has been no PND or orthopnea.  No chest pain.  No congestive heart failure or lower extremity edema.   I told her since she is currently asymptomatic and without insurance, we  could wait.  She will hopefully be applying for Medicare next year, at  which time we can do an echo.   CURRENT MEDICATIONS:  1. Digoxin 0.25 tablets half a tablet a day.  2. Warfarin as directed.  3. Pravastatin 40 a day.  She has not had recent LFTs.   PHYSICAL EXAMINATION:  VITAL SIGNS:  Remarkable for being in AFib at a  rate of 70, blood pressure 124/70, respiratory rate 14, and afebrile.  HEENT:  Unremarkable.  NECK:  Carotids are normal without bruit.  No  lymphadenopathy,  thyromegaly, or JVP elevation.  LUNGS:  Clear.  Good diaphragmatic motion.  No wheezing.  CARDIOVASCULAR:  S1 and S2.  Clicks with a systolic ejection murmur.  No  diastolic murmurs.  PMI normal.  ABDOMEN:  Benign.  Bowel sounds positive.  No hepatosplenomegaly or  hepatojugular reflux.  EXTREMITIES:  Distal pulses are intact.  No edema.  NEUROLOGIC:  Nonfocal.  SKIN:  Warm and dry.  MUSCULOSKELETAL:  No muscular weakness.   EKG shows AFib with nonspecific ST-T wave changes.   IMPRESSION:  1. Atrial fibrillation, reasonable rate control, which is digoxin,      currently asymptomatic.  2. Anticoagulation.  We will repeat INR in the Coumadin Clinic; if it      is greater than 6, we will check her hemoglobin, crit, PT/INR, and      LFTs through the intravenous  route.  3. Hypercholesterolemia, has been on pravastatin for a while, has not      had LFTs.  We will check these particularly in light of her      elevated INR.   I will see her back in 6 months and she will continue to follow up for  anticoagulation in the Coumadin Clinic.     Tammy Pick. Eden Emms, MD, [facility]  Electronically Signed    PCN/MedQ  DD: 07/05/2008  DT: 07/06/2008  Job #: [id number]

## 2010-10-20 ENCOUNTER — Ambulatory Visit (INDEPENDENT_AMBULATORY_CARE_PROVIDER_SITE_OTHER): Payer: Self-pay | Admitting: *Deleted

## 2010-10-20 DIAGNOSIS — I359 Nonrheumatic aortic valve disorder, unspecified: Secondary | ICD-10-CM

## 2010-10-20 DIAGNOSIS — I4891 Unspecified atrial fibrillation: Secondary | ICD-10-CM

## 2010-10-20 LAB — POCT INR: INR: 4

## 2010-11-10 ENCOUNTER — Ambulatory Visit (INDEPENDENT_AMBULATORY_CARE_PROVIDER_SITE_OTHER): Payer: Self-pay | Admitting: *Deleted

## 2010-11-10 DIAGNOSIS — I4891 Unspecified atrial fibrillation: Secondary | ICD-10-CM

## 2010-11-10 DIAGNOSIS — I359 Nonrheumatic aortic valve disorder, unspecified: Secondary | ICD-10-CM

## 2010-11-21 ENCOUNTER — Encounter: Payer: Self-pay | Admitting: Cardiovascular Disease

## 2010-12-08 ENCOUNTER — Ambulatory Visit (INDEPENDENT_AMBULATORY_CARE_PROVIDER_SITE_OTHER): Payer: Self-pay | Admitting: *Deleted

## 2010-12-08 DIAGNOSIS — I359 Nonrheumatic aortic valve disorder, unspecified: Secondary | ICD-10-CM

## 2010-12-08 DIAGNOSIS — I4891 Unspecified atrial fibrillation: Secondary | ICD-10-CM

## 2010-12-20 ENCOUNTER — Other Ambulatory Visit: Payer: Self-pay | Admitting: *Deleted

## 2010-12-25 ENCOUNTER — Encounter: Payer: Self-pay | Admitting: Cardiovascular Disease

## 2010-12-29 ENCOUNTER — Ambulatory Visit (INDEPENDENT_AMBULATORY_CARE_PROVIDER_SITE_OTHER): Payer: Self-pay | Admitting: Cardiovascular Disease

## 2010-12-29 ENCOUNTER — Encounter: Payer: Self-pay | Admitting: Cardiovascular Disease

## 2010-12-29 ENCOUNTER — Ambulatory Visit (INDEPENDENT_AMBULATORY_CARE_PROVIDER_SITE_OTHER): Payer: Self-pay | Admitting: *Deleted

## 2010-12-29 DIAGNOSIS — I359 Nonrheumatic aortic valve disorder, unspecified: Secondary | ICD-10-CM

## 2010-12-29 DIAGNOSIS — E7800 Pure hypercholesterolemia, unspecified: Secondary | ICD-10-CM

## 2010-12-29 DIAGNOSIS — I4891 Unspecified atrial fibrillation: Secondary | ICD-10-CM

## 2010-12-29 NOTE — Patient Instructions (Signed)
Your physician recommends that you schedule a follow-up appointment in: YEAR WITH DR NISHAN  Your physician recommends that you continue on your current medications as directed. Please refer to the Current Medication list given to you today. 

## 2010-12-29 NOTE — Assessment & Plan Note (Signed)
Echo 2/11 indicates high gradient though valve but patient asymptomatic and murmur not that impressive.  SBE and F/U echo 2/13

## 2010-12-29 NOTE — Assessment & Plan Note (Signed)
Cholesterol is at goal.  Continue current dose of statin and diet Rx.  No myalgias or side effects.  F/U  LFT's in 6 months. No results found for this basename: LDLCALC             

## 2010-12-29 NOTE — Progress Notes (Signed)
Tammy Walter is seen today for F/U of AVR,MVR and elevated lipids. She does not have outpatient insurance and we have kept testing to a minimum. I reviewed her coumadin chart and she has been Rx with no bleeding diathesis. She does have medicare hospital coverage. She denies TIA, bleeding, palpitatoins, SSCP or dyspnea. I believe she has 2 St Jude valves placed in 94 by Dr Arvilla Market in [facility]. She has no documented CAD. She has been compliant with her meds As expected with her long standing valve disease she has chronic afib with good rate control   ROS: Denies fever, malais, weight loss, blurry vision, decreased visual acuity, cough, sputum, SOB, hemoptysis, pleuritic pain, palpitaitons, heartburn, abdominal pain, melena, lower extremity edema, claudication, or rash.  All other systems reviewed and negative  General: Affect appropriate Healthy:  appears stated age HEENT: normal Neck supple with no adenopathy JVP normal no bruits no thyromegaly Lungs clear with no wheezing and good diaphragmatic motion Heart:  S1/S2 click  SEM  murmur,rub, gallop or click PMI normal Abdomen: benighn, BS positve, no tenderness, no AAA no bruit.  No HSM or HJR Distal pulses intact with no bruits No edema Neuro non-focal Skin warm and dry No muscular weakness   Current Outpatient Prescriptions  Medication Sig Dispense Refill  . Ascorbic Acid (VITAMIN C) 1000 MG tablet Take 1,000 mg by mouth daily.        Marland Kitchen aspirin 81 MG EC tablet Take 81 mg by mouth daily.        Marland Kitchen b complex vitamins tablet Take 1 tablet by mouth daily.        . Calcium Carbonate (CALCIUM 500 PO) Take 1 tablet by mouth daily.        . digoxin (LANOXIN) 0.25 MG tablet Take one half tablet daily      . warfarin (COUMADIN) 5 MG tablet Take by mouth as directed.          Allergies  Review of patient's allergies indicates no known allergies.  Electrocardiogram:   Afib rate 59 nonspecific ST T wave changes   Assessment and Plan

## 2010-12-29 NOTE — Assessment & Plan Note (Signed)
Good rate control  F/U coumadin clinic today

## 2011-01-19 ENCOUNTER — Ambulatory Visit (INDEPENDENT_AMBULATORY_CARE_PROVIDER_SITE_OTHER): Payer: Self-pay | Admitting: *Deleted

## 2011-01-19 DIAGNOSIS — I4891 Unspecified atrial fibrillation: Secondary | ICD-10-CM

## 2011-01-19 DIAGNOSIS — I359 Nonrheumatic aortic valve disorder, unspecified: Secondary | ICD-10-CM

## 2011-01-19 LAB — POCT INR: INR: 3.8

## 2011-02-02 ENCOUNTER — Encounter: Payer: Self-pay | Admitting: *Deleted

## 2011-02-09 ENCOUNTER — Ambulatory Visit (INDEPENDENT_AMBULATORY_CARE_PROVIDER_SITE_OTHER): Payer: Self-pay | Admitting: *Deleted

## 2011-02-09 DIAGNOSIS — I4891 Unspecified atrial fibrillation: Secondary | ICD-10-CM

## 2011-02-09 DIAGNOSIS — I359 Nonrheumatic aortic valve disorder, unspecified: Secondary | ICD-10-CM

## 2011-02-09 LAB — POCT INR: INR: 4.3

## 2011-03-02 ENCOUNTER — Ambulatory Visit (INDEPENDENT_AMBULATORY_CARE_PROVIDER_SITE_OTHER): Payer: Self-pay | Admitting: *Deleted

## 2011-03-02 DIAGNOSIS — I4891 Unspecified atrial fibrillation: Secondary | ICD-10-CM

## 2011-03-02 DIAGNOSIS — I359 Nonrheumatic aortic valve disorder, unspecified: Secondary | ICD-10-CM

## 2011-03-02 LAB — POCT INR: INR: 2.3

## 2011-03-30 ENCOUNTER — Ambulatory Visit (INDEPENDENT_AMBULATORY_CARE_PROVIDER_SITE_OTHER): Payer: Self-pay | Admitting: *Deleted

## 2011-03-30 DIAGNOSIS — I359 Nonrheumatic aortic valve disorder, unspecified: Secondary | ICD-10-CM

## 2011-03-30 DIAGNOSIS — Z7901 Long term (current) use of anticoagulants: Secondary | ICD-10-CM

## 2011-03-30 DIAGNOSIS — I4891 Unspecified atrial fibrillation: Secondary | ICD-10-CM

## 2011-04-27 ENCOUNTER — Ambulatory Visit (INDEPENDENT_AMBULATORY_CARE_PROVIDER_SITE_OTHER): Payer: Self-pay | Admitting: *Deleted

## 2011-04-27 DIAGNOSIS — Z7901 Long term (current) use of anticoagulants: Secondary | ICD-10-CM

## 2011-04-27 DIAGNOSIS — I4891 Unspecified atrial fibrillation: Secondary | ICD-10-CM

## 2011-04-27 DIAGNOSIS — I359 Nonrheumatic aortic valve disorder, unspecified: Secondary | ICD-10-CM

## 2011-04-27 LAB — POCT INR: INR: 3.5

## 2011-05-25 ENCOUNTER — Ambulatory Visit (INDEPENDENT_AMBULATORY_CARE_PROVIDER_SITE_OTHER): Payer: Self-pay | Admitting: *Deleted

## 2011-05-25 DIAGNOSIS — I4891 Unspecified atrial fibrillation: Secondary | ICD-10-CM

## 2011-05-25 DIAGNOSIS — I359 Nonrheumatic aortic valve disorder, unspecified: Secondary | ICD-10-CM

## 2011-05-25 DIAGNOSIS — Z7901 Long term (current) use of anticoagulants: Secondary | ICD-10-CM

## 2011-06-22 ENCOUNTER — Ambulatory Visit (INDEPENDENT_AMBULATORY_CARE_PROVIDER_SITE_OTHER): Payer: Self-pay | Admitting: *Deleted

## 2011-06-22 DIAGNOSIS — I359 Nonrheumatic aortic valve disorder, unspecified: Secondary | ICD-10-CM

## 2011-06-22 DIAGNOSIS — I4891 Unspecified atrial fibrillation: Secondary | ICD-10-CM

## 2011-06-22 DIAGNOSIS — Z7901 Long term (current) use of anticoagulants: Secondary | ICD-10-CM

## 2011-07-19 ENCOUNTER — Ambulatory Visit (INDEPENDENT_AMBULATORY_CARE_PROVIDER_SITE_OTHER): Payer: Self-pay

## 2011-07-19 DIAGNOSIS — I359 Nonrheumatic aortic valve disorder, unspecified: Secondary | ICD-10-CM

## 2011-07-19 DIAGNOSIS — I4891 Unspecified atrial fibrillation: Secondary | ICD-10-CM

## 2011-07-19 DIAGNOSIS — Z7901 Long term (current) use of anticoagulants: Secondary | ICD-10-CM

## 2011-07-19 LAB — POCT INR: INR: 1.5

## 2011-08-03 ENCOUNTER — Ambulatory Visit (INDEPENDENT_AMBULATORY_CARE_PROVIDER_SITE_OTHER): Payer: Self-pay

## 2011-08-03 DIAGNOSIS — I4891 Unspecified atrial fibrillation: Secondary | ICD-10-CM

## 2011-08-03 DIAGNOSIS — I359 Nonrheumatic aortic valve disorder, unspecified: Secondary | ICD-10-CM

## 2011-08-03 DIAGNOSIS — Z7901 Long term (current) use of anticoagulants: Secondary | ICD-10-CM

## 2011-08-23 ENCOUNTER — Ambulatory Visit (INDEPENDENT_AMBULATORY_CARE_PROVIDER_SITE_OTHER): Payer: Self-pay | Admitting: *Deleted

## 2011-08-23 DIAGNOSIS — I4891 Unspecified atrial fibrillation: Secondary | ICD-10-CM

## 2011-08-23 DIAGNOSIS — I359 Nonrheumatic aortic valve disorder, unspecified: Secondary | ICD-10-CM

## 2011-08-23 DIAGNOSIS — Z7901 Long term (current) use of anticoagulants: Secondary | ICD-10-CM

## 2011-08-23 NOTE — Patient Instructions (Signed)
Consistency with leafy green vegetable intake. 

## 2011-09-21 ENCOUNTER — Ambulatory Visit (INDEPENDENT_AMBULATORY_CARE_PROVIDER_SITE_OTHER): Payer: Self-pay

## 2011-09-21 DIAGNOSIS — I359 Nonrheumatic aortic valve disorder, unspecified: Secondary | ICD-10-CM

## 2011-09-21 DIAGNOSIS — I4891 Unspecified atrial fibrillation: Secondary | ICD-10-CM

## 2011-09-21 DIAGNOSIS — Z7901 Long term (current) use of anticoagulants: Secondary | ICD-10-CM

## 2011-09-21 LAB — POCT INR: INR: 2.7

## 2011-10-19 ENCOUNTER — Ambulatory Visit (INDEPENDENT_AMBULATORY_CARE_PROVIDER_SITE_OTHER): Payer: Self-pay | Admitting: *Deleted

## 2011-10-19 DIAGNOSIS — I4891 Unspecified atrial fibrillation: Secondary | ICD-10-CM

## 2011-10-19 DIAGNOSIS — Z7901 Long term (current) use of anticoagulants: Secondary | ICD-10-CM

## 2011-10-19 DIAGNOSIS — I359 Nonrheumatic aortic valve disorder, unspecified: Secondary | ICD-10-CM

## 2011-10-19 LAB — POCT INR: INR: 3.1

## 2011-11-16 ENCOUNTER — Ambulatory Visit (INDEPENDENT_AMBULATORY_CARE_PROVIDER_SITE_OTHER): Payer: Self-pay | Admitting: *Deleted

## 2011-11-16 DIAGNOSIS — I4891 Unspecified atrial fibrillation: Secondary | ICD-10-CM

## 2011-11-16 DIAGNOSIS — Z7901 Long term (current) use of anticoagulants: Secondary | ICD-10-CM

## 2011-11-16 DIAGNOSIS — I359 Nonrheumatic aortic valve disorder, unspecified: Secondary | ICD-10-CM

## 2011-12-07 ENCOUNTER — Ambulatory Visit (INDEPENDENT_AMBULATORY_CARE_PROVIDER_SITE_OTHER): Payer: Medicare Other | Admitting: *Deleted

## 2011-12-07 DIAGNOSIS — I4891 Unspecified atrial fibrillation: Secondary | ICD-10-CM

## 2011-12-07 DIAGNOSIS — I359 Nonrheumatic aortic valve disorder, unspecified: Secondary | ICD-10-CM

## 2011-12-07 DIAGNOSIS — Z7901 Long term (current) use of anticoagulants: Secondary | ICD-10-CM

## 2011-12-07 LAB — POCT INR: INR: 3.4

## 2012-01-04 ENCOUNTER — Ambulatory Visit (INDEPENDENT_AMBULATORY_CARE_PROVIDER_SITE_OTHER): Payer: Medicare Other

## 2012-01-04 DIAGNOSIS — I4891 Unspecified atrial fibrillation: Secondary | ICD-10-CM

## 2012-01-04 DIAGNOSIS — I359 Nonrheumatic aortic valve disorder, unspecified: Secondary | ICD-10-CM

## 2012-01-04 DIAGNOSIS — Z7901 Long term (current) use of anticoagulants: Secondary | ICD-10-CM

## 2012-01-04 LAB — PROTIME-INR: INR: 8.64 (ref <1.50)

## 2012-01-04 LAB — POCT INR: INR: >8

## 2012-01-07 ENCOUNTER — Ambulatory Visit (INDEPENDENT_AMBULATORY_CARE_PROVIDER_SITE_OTHER): Payer: Medicare Other | Admitting: *Deleted

## 2012-01-07 ENCOUNTER — Other Ambulatory Visit: Payer: Self-pay | Admitting: Internal Medicine

## 2012-01-07 ENCOUNTER — Ambulatory Visit
Admission: RE | Admit: 2012-01-07 | Discharge: 2012-01-07 | Disposition: A | Discharge status: Home / Self Care | Payer: Medicare Other | Source: Ambulatory Visit | Attending: Internal Medicine | Admitting: Internal Medicine

## 2012-01-07 DIAGNOSIS — Z7901 Long term (current) use of anticoagulants: Secondary | ICD-10-CM

## 2012-01-07 DIAGNOSIS — R4182 Altered mental status, unspecified: Secondary | ICD-10-CM

## 2012-01-07 DIAGNOSIS — R413 Other amnesia: Secondary | ICD-10-CM

## 2012-01-07 DIAGNOSIS — I4891 Unspecified atrial fibrillation: Secondary | ICD-10-CM

## 2012-01-07 DIAGNOSIS — I359 Nonrheumatic aortic valve disorder, unspecified: Secondary | ICD-10-CM

## 2012-01-07 LAB — POCT INR: INR: 2.9

## 2012-01-25 ENCOUNTER — Ambulatory Visit (INDEPENDENT_AMBULATORY_CARE_PROVIDER_SITE_OTHER): Payer: Medicare Other

## 2012-01-25 DIAGNOSIS — I4891 Unspecified atrial fibrillation: Secondary | ICD-10-CM

## 2012-01-25 DIAGNOSIS — I359 Nonrheumatic aortic valve disorder, unspecified: Secondary | ICD-10-CM

## 2012-01-25 DIAGNOSIS — Z7901 Long term (current) use of anticoagulants: Secondary | ICD-10-CM

## 2012-02-11 ENCOUNTER — Ambulatory Visit (INDEPENDENT_AMBULATORY_CARE_PROVIDER_SITE_OTHER): Payer: Medicare Other | Admitting: *Deleted

## 2012-02-11 DIAGNOSIS — Z7901 Long term (current) use of anticoagulants: Secondary | ICD-10-CM

## 2012-02-11 DIAGNOSIS — I4891 Unspecified atrial fibrillation: Secondary | ICD-10-CM

## 2012-02-11 DIAGNOSIS — I359 Nonrheumatic aortic valve disorder, unspecified: Secondary | ICD-10-CM

## 2012-02-13 ENCOUNTER — Telehealth: Payer: Self-pay | Admitting: Cardiovascular Disease

## 2012-02-13 ENCOUNTER — Encounter: Payer: Self-pay | Admitting: Cardiovascular Disease

## 2012-02-13 NOTE — Telephone Encounter (Signed)
New Problem:    I called the patient and was unable to reach them.  The phone just continually rang with no option to leave a message.  I sent a letter.

## 2012-02-29 ENCOUNTER — Ambulatory Visit (INDEPENDENT_AMBULATORY_CARE_PROVIDER_SITE_OTHER): Payer: Medicare Other | Admitting: Pharmacist

## 2012-02-29 DIAGNOSIS — I4891 Unspecified atrial fibrillation: Secondary | ICD-10-CM

## 2012-02-29 DIAGNOSIS — Z7901 Long term (current) use of anticoagulants: Secondary | ICD-10-CM

## 2012-02-29 DIAGNOSIS — I359 Nonrheumatic aortic valve disorder, unspecified: Secondary | ICD-10-CM

## 2012-02-29 LAB — POCT INR: INR: 3.3

## 2012-03-28 ENCOUNTER — Ambulatory Visit (INDEPENDENT_AMBULATORY_CARE_PROVIDER_SITE_OTHER): Payer: Medicare Other | Admitting: *Deleted

## 2012-03-28 DIAGNOSIS — I4891 Unspecified atrial fibrillation: Secondary | ICD-10-CM

## 2012-03-28 DIAGNOSIS — Z7901 Long term (current) use of anticoagulants: Secondary | ICD-10-CM

## 2012-03-28 DIAGNOSIS — I359 Nonrheumatic aortic valve disorder, unspecified: Secondary | ICD-10-CM

## 2012-03-28 LAB — POCT INR: INR: 2.7

## 2012-05-09 ENCOUNTER — Ambulatory Visit (INDEPENDENT_AMBULATORY_CARE_PROVIDER_SITE_OTHER): Payer: Medicare Other | Admitting: *Deleted

## 2012-05-09 DIAGNOSIS — I4891 Unspecified atrial fibrillation: Secondary | ICD-10-CM

## 2012-05-09 DIAGNOSIS — I359 Nonrheumatic aortic valve disorder, unspecified: Secondary | ICD-10-CM

## 2012-05-09 DIAGNOSIS — Z7901 Long term (current) use of anticoagulants: Secondary | ICD-10-CM

## 2012-05-09 LAB — POCT INR: INR: 3

## 2012-06-20 ENCOUNTER — Ambulatory Visit (INDEPENDENT_AMBULATORY_CARE_PROVIDER_SITE_OTHER): Payer: Medicare Other | Admitting: *Deleted

## 2012-06-20 ENCOUNTER — Encounter: Payer: Self-pay | Admitting: Cardiovascular Disease

## 2012-06-20 ENCOUNTER — Ambulatory Visit (INDEPENDENT_AMBULATORY_CARE_PROVIDER_SITE_OTHER): Payer: Medicare Other | Admitting: Cardiovascular Disease

## 2012-06-20 VITALS — BP 150/72 | HR 54 | Ht 65.0 in | Wt 147.0 lb

## 2012-06-20 DIAGNOSIS — I4891 Unspecified atrial fibrillation: Secondary | ICD-10-CM

## 2012-06-20 DIAGNOSIS — Z954 Presence of other heart-valve replacement: Secondary | ICD-10-CM

## 2012-06-20 DIAGNOSIS — Z952 Presence of prosthetic heart valve: Secondary | ICD-10-CM

## 2012-06-20 DIAGNOSIS — Z7901 Long term (current) use of anticoagulants: Secondary | ICD-10-CM

## 2012-06-20 DIAGNOSIS — E7800 Pure hypercholesterolemia, unspecified: Secondary | ICD-10-CM

## 2012-06-20 DIAGNOSIS — I359 Nonrheumatic aortic valve disorder, unspecified: Secondary | ICD-10-CM

## 2012-06-20 LAB — POCT INR: INR: 3.6

## 2012-06-20 NOTE — Progress Notes (Signed)
Patient ID: Tammy Walter, female   DOB: 1946/05/22, 67 y.o.   MRN: [id number] Tammy Walter is seen today for F/U of AVR,MVR and elevated lipids. She does not have outpatient insurance and we have kept testing to a minimum. I reviewed her coumadin chart and she has been Rx with no bleeding diathesis. She does have medicare hospital coverage. She denies TIA, bleeding, palpitatoins, SSCP or dyspnea. I believe she has 2 St Jude valves placed in 94 by Dr Arvilla Market in [facility]. She has no documented CAD. She has been compliant with her meds As expected with her long standing valve disease she has chronic afib with good rate control  Husband does not like any testing done.  They do have medicare now.  She has has dementia and is on multiple meds and has been seen by psychiatry and neurology Echo 2011 Study Conclusions  - Left ventricle: The cavity size was normal. Wall thickness was normal. Systolic function was normal. The estimated ejection fraction was in the range of 55% to 60%. Wall motion was normal; there were no regional wall motion abnormalities. - Aortic valve: Mild regurgitation. - Mitral valve: A mechanical prosthesis was present. Mild regurgitation. Valve area by pressure half-time: 2.14cm^2. - Left atrium: The atrium was severely dilated. - Right atrium: The atrium was moderately dilated. - Tricuspid valve: Mild-moderate regurgitation. Impressions:  - Aortic valve not well visualized but gradient severely elevated (4.6 m/s, mean gradient 51 mmHg).    ROS: Denies fever, malais, weight loss, blurry vision, decreased visual acuity, cough, sputum, SOB, hemoptysis, pleuritic pain, palpitaitons, heartburn, abdominal pain, melena, lower extremity edema, claudication, or rash.  All other systems reviewed and negative  General: Affect appropriate Healthy:  appears stated age HEENT: normal Neck supple with no adenopathy JVP normal no bruits no thyromegaly Lungs clear with no wheezing and good  diaphragmatic motion Heart:  S1/S2 clicks SEM no MR  murmur, no rub, gallop or click PMI normal Abdomen: benighn, BS positve, no tenderness, no AAA no bruit.  No HSM or HJR Distal pulses intact with no bruits No edema Neuro non-focal Skin warm and dry No muscular weakness   Current Outpatient Prescriptions  Medication Sig Dispense Refill  . Ascorbic Acid (VITAMIN C) 1000 MG tablet Take 1,000 mg by mouth daily.        Marland Kitchen aspirin 81 MG EC tablet Take 81 mg by mouth daily.        Marland Kitchen b complex vitamins tablet Take 1 tablet by mouth daily.        . Calcium Carbonate (CALCIUM 500 PO) Take 1 tablet by mouth daily.        . digoxin (LANOXIN) 0.25 MG tablet Take one half tablet daily      . donepezil (ARICEPT) 5 MG tablet Take 5 mg by mouth daily.      . memantine (NAMENDA) 10 MG tablet Take 10 mg by mouth daily.       Marland Kitchen warfarin (COUMADIN) 5 MG tablet Take by mouth as directed.          Allergies  Review of patient's allergies indicates no known allergies.  Electrocardiogram:  Assessment and Plan

## 2012-06-20 NOTE — Assessment & Plan Note (Signed)
Good rate control and anticoagulaiton  

## 2012-06-20 NOTE — Assessment & Plan Note (Signed)
Cholesterol is at goal.  Continue current dose of statin and diet Rx.  No myalgias or side effects.  F/U  LFT's in 6 months. No results found for this basename: LDLCALC  Labs with primary            

## 2012-06-20 NOTE — Patient Instructions (Signed)
Your physician wants you to follow-up in: YEAR WITH DR Haywood Filler will receive a reminder letter in the mail two months in advance. If you don't receive a letter, please call our office to schedule the follow-up appointment. Your physician recommends that you continue on your current medications as directed. Please refer to the Current Medication list given to you today. Your physician has requested that you have an echocardiogram. Echocardiography is a painless test that uses sound waves to create images of your heart. It provides your doctor with information about the size and shape of your heart and how well your heart's chambers and valves are working. This procedure takes approximately one hour. There are no restrictions for this procedure. Dx avr

## 2012-06-20 NOTE — Assessment & Plan Note (Signed)
AVR/MVR 29' HP with Dr MIlls Asymptomatic.  High gradient through AV 3 years ago but reader did not comment on artficial valve in both mitral and aortic position  F/U echo No need for SBE as she has full dentures

## 2012-07-11 ENCOUNTER — Other Ambulatory Visit (HOSPITAL_COMMUNITY): Payer: Medicare Other

## 2012-07-22 ENCOUNTER — Ambulatory Visit (INDEPENDENT_AMBULATORY_CARE_PROVIDER_SITE_OTHER): Payer: Medicare Other | Admitting: Pharmacist

## 2012-07-22 ENCOUNTER — Ambulatory Visit (HOSPITAL_COMMUNITY): Payer: Medicare Other | Attending: Cardiology | Admitting: Radiology

## 2012-07-22 ENCOUNTER — Telehealth: Payer: Self-pay | Admitting: Cardiovascular Disease

## 2012-07-22 DIAGNOSIS — I359 Nonrheumatic aortic valve disorder, unspecified: Secondary | ICD-10-CM | POA: Insufficient documentation

## 2012-07-22 DIAGNOSIS — Z952 Presence of prosthetic heart valve: Secondary | ICD-10-CM

## 2012-07-22 DIAGNOSIS — Z954 Presence of other heart-valve replacement: Secondary | ICD-10-CM | POA: Insufficient documentation

## 2012-07-22 DIAGNOSIS — I4891 Unspecified atrial fibrillation: Secondary | ICD-10-CM

## 2012-07-22 DIAGNOSIS — E785 Hyperlipidemia, unspecified: Secondary | ICD-10-CM | POA: Insufficient documentation

## 2012-07-22 DIAGNOSIS — I059 Rheumatic mitral valve disease, unspecified: Secondary | ICD-10-CM | POA: Insufficient documentation

## 2012-07-22 DIAGNOSIS — Z7901 Long term (current) use of anticoagulants: Secondary | ICD-10-CM

## 2012-07-22 NOTE — Progress Notes (Signed)
Echocardiogram performed.  

## 2012-07-22 NOTE — Telephone Encounter (Signed)
NEED TO SCHEDULE PT  TEE ON TUES AM WITH DR CRENSHAW  AND RIGHT AND LEFT HEART CATH PM  WITH DR Swaziland  IN PT  NEEDS TO BE ADMITTED ON MON PENDING INR  RESULTS./CY

## 2012-07-23 ENCOUNTER — Encounter (HOSPITAL_COMMUNITY): Payer: Self-pay | Admitting: Pharmacy Technician

## 2012-07-28 ENCOUNTER — Other Ambulatory Visit: Payer: Self-pay

## 2012-07-28 ENCOUNTER — Encounter (HOSPITAL_COMMUNITY): Payer: Self-pay | Admitting: Nurse Practitioner

## 2012-07-28 ENCOUNTER — Inpatient Hospital Stay (HOSPITAL_COMMUNITY)
Admission: RE | Admit: 2012-07-28 | Discharge: 2012-08-06 | DRG: 287 | Disposition: A | Discharge status: Home / Self Care | Payer: Medicare Other | Source: Ambulatory Visit | Attending: Cardiovascular Disease | Admitting: Cardiovascular Disease

## 2012-07-28 ENCOUNTER — Ambulatory Visit (INDEPENDENT_AMBULATORY_CARE_PROVIDER_SITE_OTHER): Payer: Medicare Other

## 2012-07-28 ENCOUNTER — Other Ambulatory Visit: Payer: Self-pay | Admitting: Cardiovascular Disease

## 2012-07-28 DIAGNOSIS — Z7901 Long term (current) use of anticoagulants: Secondary | ICD-10-CM

## 2012-07-28 DIAGNOSIS — T8209XD Other mechanical complication of heart valve prosthesis, subsequent encounter: Secondary | ICD-10-CM

## 2012-07-28 DIAGNOSIS — E7800 Pure hypercholesterolemia, unspecified: Secondary | ICD-10-CM

## 2012-07-28 DIAGNOSIS — I724 Aneurysm of artery of lower extremity: Secondary | ICD-10-CM

## 2012-07-28 DIAGNOSIS — Z954 Presence of other heart-valve replacement: Secondary | ICD-10-CM

## 2012-07-28 DIAGNOSIS — I350 Nonrheumatic aortic (valve) stenosis: Secondary | ICD-10-CM

## 2012-07-28 DIAGNOSIS — Y849 Medical procedure, unspecified as the cause of abnormal reaction of the patient, or of later complication, without mention of misadventure at the time of the procedure: Secondary | ICD-10-CM | POA: Diagnosis present

## 2012-07-28 DIAGNOSIS — I498 Other specified cardiac arrhythmias: Secondary | ICD-10-CM | POA: Diagnosis present

## 2012-07-28 DIAGNOSIS — R001 Bradycardia, unspecified: Secondary | ICD-10-CM

## 2012-07-28 DIAGNOSIS — T8209XA Other mechanical complication of heart valve prosthesis, initial encounter: Secondary | ICD-10-CM

## 2012-07-28 DIAGNOSIS — I050 Rheumatic mitral stenosis: Secondary | ICD-10-CM | POA: Diagnosis present

## 2012-07-28 DIAGNOSIS — I359 Nonrheumatic aortic valve disorder, unspecified: Secondary | ICD-10-CM

## 2012-07-28 DIAGNOSIS — I4891 Unspecified atrial fibrillation: Secondary | ICD-10-CM

## 2012-07-28 DIAGNOSIS — F0390 Unspecified dementia without behavioral disturbance: Secondary | ICD-10-CM | POA: Diagnosis present

## 2012-07-28 DIAGNOSIS — Z87891 Personal history of nicotine dependence: Secondary | ICD-10-CM

## 2012-07-28 DIAGNOSIS — I729 Aneurysm of unspecified site: Secondary | ICD-10-CM | POA: Diagnosis not present

## 2012-07-28 DIAGNOSIS — T82897A Other specified complication of cardiac prosthetic devices, implants and grafts, initial encounter: Principal | ICD-10-CM | POA: Diagnosis present

## 2012-07-28 DIAGNOSIS — IMO0002 Reserved for concepts with insufficient information to code with codable children: Secondary | ICD-10-CM | POA: Diagnosis present

## 2012-07-28 DIAGNOSIS — Z79899 Other long term (current) drug therapy: Secondary | ICD-10-CM

## 2012-07-28 HISTORY — DX: Other mechanical complication of heart valve prosthesis, initial encounter: T82.09XA

## 2012-07-28 HISTORY — DX: Presence of other heart-valve replacement: Z95.4

## 2012-07-28 HISTORY — DX: Rheumatic mitral valve disease, unspecified: I05.9

## 2012-07-28 HISTORY — DX: Permanent atrial fibrillation: I48.21

## 2012-07-28 HISTORY — DX: Nonrheumatic aortic valve disorder, unspecified: I35.9

## 2012-07-28 HISTORY — DX: Unspecified dementia, unspecified severity, without behavioral disturbance, psychotic disturbance, mood disturbance, and anxiety: F03.90

## 2012-07-28 LAB — CBC
Hemoglobin: 13.9 g/dL (ref 12.0–15.0)
MCH: 31 pg (ref 26.0–34.0)
RBC: 4.48 MIL/uL (ref 3.87–5.11)
WBC: 8.6 10*3/uL (ref 4.0–10.5)

## 2012-07-28 LAB — BASIC METABOLIC PANEL
CO2: 30 mEq/L (ref 19–32)
Calcium: 9.3 mg/dL (ref 8.4–10.5)
Chloride: 103 mEq/L (ref 96–112)
Glucose, Bld: 90 mg/dL (ref 70–99)
Sodium: 139 mEq/L (ref 135–145)

## 2012-07-28 LAB — PROTIME-INR: Prothrombin Time: 15.8 seconds — ABNORMAL HIGH (ref 11.6–15.2)

## 2012-07-28 LAB — POCT INR: INR: 1.3

## 2012-07-28 MED ORDER — SODIUM CHLORIDE 0.9 % IJ SOLN: 3.0000 mL | INTRAMUSCULAR | Status: DC | PRN

## 2012-07-28 MED ORDER — HEPARIN BOLUS VIA INFUSION
4000.0000 [IU] | Freq: Once | INTRAVENOUS | Status: AC
  Administered 2012-07-28: 4000 [IU] via INTRAVENOUS
  Filled 2012-07-28: qty 4000

## 2012-07-28 MED ORDER — B COMPLEX PO TABS: 1.0000 | ORAL_TABLET | Freq: Every day | ORAL | Status: DC

## 2012-07-28 MED ORDER — ASPIRIN EC 81 MG PO TBEC: 81.0000 mg | DELAYED_RELEASE_TABLET | Freq: Every day | ORAL | Status: DC

## 2012-07-28 MED ORDER — HEPARIN (PORCINE) IN NACL 100-0.45 UNIT/ML-% IJ SOLN
1000.0000 [IU]/h | INTRAMUSCULAR | Status: DC
  Administered 2012-07-28 – 2012-07-29 (×2): 1000 [IU]/h via INTRAVENOUS
  Filled 2012-07-28 (×4): qty 250

## 2012-07-28 MED ORDER — MEMANTINE HCL 10 MG PO TABS: 10.0000 mg | ORAL_TABLET | Freq: Every day | ORAL | Status: DC

## 2012-07-28 MED ORDER — ASPIRIN EC 81 MG PO TBEC
81.0000 mg | DELAYED_RELEASE_TABLET | Freq: Every day | ORAL | Status: DC
  Administered 2012-07-30 – 2012-07-31 (×2): 81 mg via ORAL
  Filled 2012-07-28 (×2): qty 1

## 2012-07-28 MED ORDER — SODIUM CHLORIDE 0.9 % IV SOLN
INTRAVENOUS | Status: DC
Start: 2012-07-28 — End: 2012-07-29
  Administered 2012-07-28: 20 mL/h via INTRAVENOUS
  Administered 2012-07-29: 500 mL via INTRAVENOUS

## 2012-07-28 MED ORDER — ASPIRIN 81 MG PO TBEC: 81.0000 mg | DELAYED_RELEASE_TABLET | Freq: Every day | ORAL | Status: DC

## 2012-07-28 MED ORDER — MEMANTINE HCL 10 MG PO TABS
10.0000 mg | ORAL_TABLET | Freq: Two times a day (BID) | ORAL | Status: DC
  Administered 2012-07-28 – 2012-08-06 (×18): 10 mg via ORAL
  Filled 2012-07-28 (×20): qty 1

## 2012-07-28 MED ORDER — L-METHYLFOLATE-B12-B6-B2 6-1-50-5 MG PO TABS: 1.0000 | ORAL_TABLET | Freq: Every day | ORAL | Status: DC

## 2012-07-28 MED ORDER — OMEGA-3 FATTY ACIDS 1000 MG PO CAPS: 1.0000 g | ORAL_CAPSULE | Freq: Every day | ORAL | Status: DC

## 2012-07-28 MED ORDER — ASPIRIN EC 81 MG PO TBEC
81.0000 mg | DELAYED_RELEASE_TABLET | Freq: Once | ORAL | Status: DC
  Filled 2012-07-28: qty 1

## 2012-07-28 MED ORDER — VITAMIN C 500 MG PO TABS
500.0000 mg | ORAL_TABLET | Freq: Three times a day (TID) | ORAL | Status: DC
  Administered 2012-07-28 – 2012-08-06 (×25): 500 mg via ORAL
  Filled 2012-07-28 (×29): qty 1

## 2012-07-28 MED ORDER — DONEPEZIL HCL 10 MG PO TABS
10.0000 mg | ORAL_TABLET | Freq: Every day | ORAL | Status: DC
  Administered 2012-07-28 – 2012-08-05 (×9): 10 mg via ORAL
  Filled 2012-07-28 (×10): qty 1

## 2012-07-28 MED ORDER — DIGOXIN 250 MCG PO TABS: 12.5000 mg | ORAL_TABLET | Freq: Every day | ORAL | Status: DC

## 2012-07-28 MED ORDER — SODIUM CHLORIDE 0.9 % IV SOLN: 250.0000 mL | INTRAVENOUS | Status: DC | PRN

## 2012-07-28 MED ORDER — DIGOXIN 125 MCG PO TABS
0.1250 mg | ORAL_TABLET | Freq: Every day | ORAL | Status: DC
  Administered 2012-07-30 – 2012-08-02 (×3): 0.125 mg via ORAL
  Filled 2012-07-28 (×6): qty 1

## 2012-07-28 MED ORDER — DONEPEZIL HCL 10 MG PO TABS: 10.0000 mg | ORAL_TABLET | Freq: Every day | ORAL | Status: DC

## 2012-07-28 MED ORDER — SODIUM CHLORIDE 0.9 % IJ SOLN
3.0000 mL | Freq: Two times a day (BID) | INTRAMUSCULAR | Status: DC
  Administered 2012-07-30 – 2012-07-31 (×3): 3 mL via INTRAVENOUS

## 2012-07-28 MED ORDER — B COMPLEX-C PO TABS
1.0000 | ORAL_TABLET | Freq: Every day | ORAL | Status: DC
  Administered 2012-07-30 – 2012-08-06 (×8): 1 via ORAL
  Filled 2012-07-28 (×10): qty 1

## 2012-07-28 MED ORDER — ADULT MULTIVITAMIN W/MINERALS CH
1.0000 | ORAL_TABLET | Freq: Every day | ORAL | Status: DC
  Administered 2012-07-28 – 2012-08-06 (×9): 1 via ORAL
  Filled 2012-07-28 (×10): qty 1

## 2012-07-28 MED ORDER — SODIUM CHLORIDE 0.9 % IJ SOLN
3.0000 mL | Freq: Two times a day (BID) | INTRAMUSCULAR | Status: DC
  Administered 2012-07-29: 3 mL via INTRAVENOUS
  Administered 2012-07-31: 22:00:00 via INTRAVENOUS

## 2012-07-28 MED ORDER — OMEGA-3-ACID ETHYL ESTERS 1 G PO CAPS
1.0000 g | ORAL_CAPSULE | Freq: Every day | ORAL | Status: DC
  Administered 2012-07-28 – 2012-08-06 (×9): 1 g via ORAL
  Filled 2012-07-28 (×10): qty 1

## 2012-07-28 MED ORDER — ASPIRIN 81 MG PO CHEW
324.0000 mg | CHEWABLE_TABLET | ORAL | Status: AC
  Administered 2012-07-29: 324 mg via ORAL
  Filled 2012-07-28: qty 4

## 2012-07-28 MED ORDER — SODIUM CHLORIDE 0.9 % IJ SOLN
3.0000 mL | Freq: Two times a day (BID) | INTRAMUSCULAR | Status: DC
  Administered 2012-07-28 (×2): 3 mL via INTRAVENOUS

## 2012-07-28 MED ORDER — FA-PYRIDOXINE-CYANOCOBALAMIN 2.5-25-2 MG PO TABS
1.0000 | ORAL_TABLET | Freq: Every day | ORAL | Status: DC
  Administered 2012-07-30 – 2012-08-06 (×8): 1 via ORAL
  Filled 2012-07-28 (×10): qty 1

## 2012-07-28 NOTE — H&P (Signed)
Tammy Walter was seen 06/24/11 for F/U of AVR,MVR and elevated lipids. She does not have outpatient insurance and we have kept testing to a minimum. I reviewed her coumadin chart and she has been Rx with no bleeding diathesis. She does have medicare hospital coverage. She denies TIA, bleeding, palpitatoins, SSCP or dyspnea. I believe she has 2 St Jude valves placed in 94 by Dr Arvilla Market in [facility]. She has no documented CAD. She has been compliant with her meds As expected with her long standing valve disease she has chronic afib with good rate control Husband does not like any testing done. They do have medicare now. She has has dementia and is on multiple meds and has been seen by psychiatry and neurology   Unfortunately her echo showed severe AS and likely stuck leaflet on her St Jude valve  MVR ok.   Echo 07/22/12 Study Conclusions  - Left ventricle: The cavity size was normal. Wall thickness was increased in a pattern of mild LVH. Systolic function was normal. The estimated ejection fraction was in the range of 55% to 60%. Wall motion was normal; there were no regional wall motion abnormalities. - Aortic valve: A mechanical prosthesis was present. There was critical stenosis. Mean gradient: 72mm Hg (S). Peak gradient: Hg (S). - Mitral valve: A mechanical prosthesis was present. Mean gradient: 7mm Hg (D). Peak gradient: 22mm Hg (D). Valve area by pressure half-time: 2.02cm^2. - Left atrium: The atrium was moderately dilated. - Right atrium: The atrium was moderately dilated. - Pulmonary arteries: Systolic pressure was mildly increased. PA peak pressure: 38mm Hg (S). Impressions:  - MV prosthesis appears to be functioning well; AV prosthesis not well visualized but gradients are severely elevated with mean gradient of 72 mmHg (? valve dysfunction vs mismatch); suggest TEE to further assess.  She has held her coumadin since Friday and INR today 1.3  To be hospitalized for heparin.  TEE and  right and left heart cath scheduled for tomorrow  ROS: Denies fever, malais, weight loss, blurry vision, decreased visual acuity, cough, sputum, SOB, hemoptysis, pleuritic pain, palpitaitons, heartburn, abdominal pain, melena, lower extremity edema, claudication, or rash.  All other systems reviewed and negative   General: Affect appropriate Healthy:  appears stated age HEENT: normal Neck supple with no adenopathy JVP normal no bruits no thyromegaly Lungs clear with no wheezing and good diaphragmatic motion Heart:  S1/S2 cllck SEM  murmur,rub, gallop or click PMI normal Abdomen: benighn, BS positve, no tenderness, no AAA no bruit.  No HSM or HJR Distal pulses intact with no bruits No edema Neuro non-focal Skin warm and dry No muscular weakness  Medications No current facility-administered medications for this encounter.    Allergies Review of patient's allergies indicates no known allergies.  Family History: No family history on file.  Social History: History   Social History  . Marital Status: Married    Spouse Name: N/A    Number of Children: N/A  . Years of Education: N/A   Occupational History  . Not on file.   Social History Main Topics  . Smoking status: Former Games developer  . Smokeless tobacco: Not on file  . Alcohol Use: No  . Drug Use: No  . Sexually Active: Not on file   Other Topics Concern  . Not on file   Social History Narrative   Pt walks her [name], [name] on a regular basis    Electrocardiogram:  Afib nonspecific ST/ T wave changes  Assessment and  Plan AVR/MVR:  Coumadin held.  Start heparin.  TEE tomorrow at 9:00 with Dr Jens Som and right and left cath to follow with Dr Swaziland at noon Suspect she will need inpatient evaluation for redo MVR/AVR.  Since MVR so old suspect it will be replaced with aortic valve.  Would make sure AVR is fluroed during cath to further assess leaflet motion.   Dementia:  She is on namenda and aricept.   Dont think it is a reason not to do repeat surgery Fairly compensated and husband is attentibve  [employer]

## 2012-07-28 NOTE — Progress Notes (Signed)
Pt transferred top 2025 for safety; pt with dementia; will cont. To monitor.

## 2012-07-28 NOTE — H&P (Signed)
Patient ID: Tammy Walter MRN: [id number], DOB/AGE: 1946-05-21   Admit date: 07/28/2012  Primary Physician: Ralene Ok, MD Primary Cardiologist: P. Eden Emms, MD  Pt. Profile:  67 y/o female with h/o mvr/avr who was recently found to have malfunctioning avr who presents for admission/heparin bridging/tee/cath.  Problem List  Past Medical History  Diagnosis Date  . Atrial fibrillation   . Hypercholesterolemia   . Long term (current) use of anticoagulants     coumadin therapy  . Aortic valve disorder     a. s/p mech avr in 1986 - HP;  b. 06/2012 Echo: EF 55-60%, Critical AS, mean grad , Peak grad , mvr functioning well.  . Mitral valve disorder     a. s/p mech mvr in 1986 - HP;  b. 06/2012 Echo: nl fxnng mvr.  . Dementia     Past Surgical History  Procedure Laterality Date  . Anomalous pulmonary venous return repair    . Mitral valve replacement      Allergies  No Known Allergies  HPI  67 y/o female with the above problem list.  She was recently seen in clinic by Dr. Eden Emms on 1/24.  She was doing well at the time.  An echo was ordered to f/u elevated gradient through AV noted on echo 3 yrs prior.  This was performed on 2/25 and showed critical AS with question of malfunctioning valve vs mismatch.  Pt and husband were contacted and advised to hold coumadin with plan for INR today and once INR subRx->admission for bridging to be followed by TEE/cath.  INR was 1.3 this AM.  She presents now for elective admission.  TEE and cath are scheduled for tomorrow.  She denies chest pain, palpitations, dyspnea, pnd, orthopnea, n, v, dizziness, syncope, edema, weight gain, or early satiety.  Home Medications  Prior to Admission medications   Medication Sig Start Date End Date Taking? Authorizing Provider  aspirin 81 MG EC tablet Take 81 mg by mouth daily.      Historical Provider, MD  b complex vitamins tablet Take 1 tablet by mouth daily.      Historical Provider, MD  Calcium  Carbonate (CALCIUM 500 PO) Take 1 tablet by mouth daily.      Historical Provider, MD  Dietary Management Product (AXONA PO) Take 1 packet by mouth daily.    Historical Provider, MD  digoxin (LANOXIN) 0.25 MG tablet Take 12.5 mg by mouth daily.     Historical Provider, MD  donepezil (ARICEPT) 10 MG tablet Take 10 mg by mouth at bedtime.    Historical Provider, MD  fish oil-omega-3 fatty acids 1000 MG capsule Take 1 g by mouth daily.    Historical Provider, MD  l-methylfolate-b2-b6-b12 (CEREFOLIN) 10-26-48-5 MG TABS Take 1 tablet by mouth daily.    Historical Provider, MD  memantine (NAMENDA) 10 MG tablet Take 10 mg by mouth 2 (two) times daily.     Historical Provider, MD  Multiple Vitamin (MULTIVITAMIN WITH MINERALS) TABS Take 1 tablet by mouth daily.    Historical Provider, MD  vitamin C (ASCORBIC ACID) 500 MG tablet Take 500 mg by mouth 3 (three) times daily.    Historical Provider, MD  vitamin E 400 UNIT capsule Take 400 Units by mouth daily.    Historical Provider, MD  warfarin (COUMADIN) 5 MG tablet Take 2.5-5 mg by mouth daily. 2.5mg  on M, F 5mg  on S, T, W, Th, Sat    Historical Provider, MD   Family History  No h/o  premature CAD.  Social History  History   Social History  . Marital Status: Married    Spouse Name: N/A    Number of Children: N/A  . Years of Education: N/A   Occupational History  . Not on file.   Social History Main Topics  . Smoking status: Former Games developer  . Smokeless tobacco: Not on file  . Alcohol Use: No  . Drug Use: No  . Sexually Active: Not on file   Other Topics Concern  . Not on file   Social History Narrative   Pt walks her [name], [name] on a regular basis    Review of Systems General:  No chills, fever, night sweats or weight changes.  Cardiovascular:  No chest pain, dyspnea on exertion, edema, orthopnea, palpitations, paroxysmal nocturnal dyspnea. Dermatological: No rash, lesions/masses Respiratory: No cough, dyspnea Urologic:  No hematuria, dysuria Abdominal:   No nausea, vomiting, diarrhea, bright red blood per rectum, melena, or hematemesis Neurologic:  No visual changes, wkns, changes in mental status.  +++ h/o dementia. All other systems reviewed and are otherwise negative except as noted above.  Physical Exam  There were no vitals taken for this visit.  General: Pleasant, NAD Psych: Normal affect. Neuro: Alert and oriented X 3. Moves all extremities spontaneously. HEENT: Normal  Neck: Supple without bruits or JVD. Lungs:  Resp regular and unlabored, CTA. Heart: ir, ir, mech s1/s2 click.  No murmurs. Abdomen: Soft, non-tender, non-distended, BS + x 4.  Extremities: No clubbing, cyanosis or edema. DP/PT/Radials 2+ and equal bilaterally.  Labs  INR 1.3 today.  ASSESSMENT AND PLAN  1.  Critical AS:  Pt being admitted electively today for heparin bridging to be followed by TEE and cath tomorrow.    2.  S/P AVR/MVR:  Heparin bridging while coumadin on hold.  3.  Dementia:  Cont home meds.  4.  Afib:  Chronic, rate-controlled on digoxin.  Coumadin on hold/heparin bridge.  Signed, Nicolasa Ducking, NP 07/28/2012, 10:48 AM

## 2012-07-28 NOTE — Consult Note (Signed)
ANTICOAGULATION CONSULT NOTE - Initial Consult  Pharmacy Consult for Heparin Indication: Heparin bridging in patient with history of AVR/MVR  Allergies: No Known Allergies  Height/Weight: Height: 5\' 5"  (165.1 cm) Weight: 149 lb 4 oz (67.7 kg) IBW/kg (Calculated) : 57 Dosing weight 68 kg  Vital Signs: BP 123/62  Pulse 68  Temp(Src) 98.5 F (36.9 C) (Oral)  Resp 16  Ht 5\' 5"  (1.651 m)  Wt 149 lb 4 oz (67.7 kg)  BMI 24.84 kg/m2  SpO2 100%  Active Problems: Active Problems:   HYPERCHOLESTEROLEMIA   Aortic valve disorders   Atrial fibrillation   Labs:  Recent Labs  07/28/12 0822 07/28/12 1146 07/28/12 1853  HGB  --  13.9  --   HCT  --  42.0  --   PLT  --  135*  --   LABPROT  --  15.8*  --   INR 1.3 1.29  --   HEPARINUNFRC  --   --  0.62  CREATININE  --  0.51  --    Assessment: 67 y.o.female with h/o mvr/avr now found to have malfunctioning avr continues on IV heparin while coumadin is on hold. Initial heparin level is 0.62 which is at goal. No bleeding noted.   Goal of Therapy:  Heparin level 0.3-0.7 units/ml      Plan:  1. Continue heparin IV at 1000 units/hr 2. F/u AM heparin level to confirm dosing  Lysle Pearl, PharmD, BCPS Pager # [phone] 07/28/2012 7:39 PM

## 2012-07-28 NOTE — H&P (Signed)
See my H and P  Old AVR/MVR done by Dr Arvilla Market about 18 years ago.  Mean gradient across AV over 70 with likely stuck leaflet.  Coumadin held Friday and INR 1.3  Admitted for heparin and TEE/ and cath to be done tomorrow  Tammy Haws

## 2012-07-28 NOTE — Consult Note (Signed)
ANTICOAGULATION CONSULT NOTE - Initial Consult  Pharmacy Consult for Heparin Indication: Heparin bridging in patient with history of AVR/MVR  Allergies: No Known Allergies  Height/Weight: Height: 5\' 5"  (165.1 cm) Weight: 149 lb 4 oz (67.7 kg) IBW/kg (Calculated) : 57 Dosing weight 68 kg  Vital Signs: BP 133/69  Pulse 51  Temp(Src) 98 F (36.7 C) (Oral)  Resp 16  Ht 5\' 5"  (1.651 m)  Wt 149 lb 4 oz (67.7 kg)  BMI 24.84 kg/m2  SpO2 100%  Active Problems: Active Problems:   HYPERCHOLESTEROLEMIA   Aortic valve disorders   Atrial fibrillation   Labs:  Recent Labs  07/28/12 0822  INR 1.3   Lab Results  Component Value Date   INR 1.3 07/28/2012   INR 3.0 07/22/2012   INR 3.6 06/20/2012   PROTIME 22.8 11/01/2008   Medical / Surgical History: Past Medical History  Diagnosis Date  . Atrial fibrillation   . Hypercholesterolemia   . Long term (current) use of anticoagulants     coumadin therapy  . Aortic valve disorder     a. s/p mech avr in 1986 - [facility];  b. 06/2012 Echo: EF 55-60%, Critical AS, mean grad , Peak grad , mvr functioning well.  . Mitral valve disorder     a. s/p mech mvr in 1986 - [facility];  b. 06/2012 Echo: nl fxnng mvr.  . Dementia    Past Surgical History  Procedure Laterality Date  . Anomalous pulmonary venous return repair    . Mitral valve replacement      Medications:  Medication Sig  . aspirin 81 MG EC tablet Take 81 mg by mouth daily.    Marland Kitchen b complex vitamins tablet Take 1 tablet by mouth daily.    . Calcium Carbonate (CALCIUM 500 PO) Take 1 tablet by mouth daily.    . Dietary Management Product (AXONA PO) Take 1 packet by mouth daily.  . digoxin (LANOXIN) 0.25 MG tablet Take 12.5 mg by mouth daily.   Marland Kitchen donepezil (ARICEPT) 10 MG tablet Take 10 mg by mouth at bedtime.  . fish oil-omega-3 fatty acids 1000 MG capsule Take 1 g by mouth daily.  Marland Kitchen l-methylfolate-b2-b6-b12 (CEREFOLIN) 10-26-48-5 MG TABS Take 1 tablet by mouth daily.  .  memantine (NAMENDA) 10 MG tablet Take 10 mg by mouth 2 (two) times daily.   . Multiple Vitamin (MULTIVITAMIN WITH MINERALS) TABS Take 1 tablet by mouth daily.  . vitamin C (ASCORBIC ACID) 500 MG tablet Take 500 mg by mouth 3 (three) times daily.  . vitamin E 400 UNIT capsule Take 400 Units by mouth daily.  Marland Kitchen warfarin (COUMADIN) 5 MG tablet Take 2.5-5 mg by mouth daily. 2.5mg  on M, F 5mg  on S, T, W, Th, Sat   Scheduled:  . [START ON 07/29/2012] aspirin  324 mg Oral Pre-Cath  . aspirin EC  81 mg Oral Once   Followed by  . [START ON 07/30/2012] aspirin EC  81 mg Oral Daily  . B-complex with vitamin C  1 tablet Oral Daily  . digoxin  12.5 mg Oral Daily  . donepezil  10 mg Oral QHS  . folic acid-pyridoxine-cyancobalamin  1 tablet Oral Daily  . memantine  10 mg Oral BID  . multivitamin with minerals  1 tablet Oral Daily  . omega-3 acid ethyl esters  1 g Oral Daily  . sodium chloride  3 mL Intravenous Q12H  . sodium chloride  3 mL Intravenous Q12H  . sodium chloride  3  mL Intravenous Q12H  . vitamin C  500 mg Oral TID    Assessment:  67 y.o.female with h/o mvr/avr now found to have malfunctioning avr   She presents for admission and heparin bridging for TEE and Cath.  INR on admission 1.3 with Coumadin on hold.  Heparin bridging to begin per protocol.  Goal of Therapy:   Heparin level 0.3-0.7 units/ml      Plan:   Heparin 4000 unit load, then  Begin Heparin infusion at 1000 units/hr Will draw 6 hour Hep Level  Daily Heparin level and CBC while on Heparin.    Shaine Mount, Elisha Headland,  Pharm.D.. 07/28/2012,  12:15 PM

## 2012-07-29 ENCOUNTER — Encounter (HOSPITAL_COMMUNITY)
Admission: RE | Disposition: A | Discharge status: Home / Self Care | Payer: Self-pay | Source: Ambulatory Visit | Attending: Cardiovascular Disease

## 2012-07-29 ENCOUNTER — Ambulatory Visit (HOSPITAL_COMMUNITY): Admission: RE | Admit: 2012-07-29 | Payer: Medicare Other | Source: Ambulatory Visit | Admitting: Cardiology

## 2012-07-29 ENCOUNTER — Encounter (HOSPITAL_COMMUNITY): Payer: Self-pay | Admitting: *Deleted

## 2012-07-29 DIAGNOSIS — I359 Nonrheumatic aortic valve disorder, unspecified: Secondary | ICD-10-CM

## 2012-07-29 HISTORY — PX: LEFT AND RIGHT HEART CATHETERIZATION WITH CORONARY ANGIOGRAM: SHX5449

## 2012-07-29 HISTORY — PX: TEE WITHOUT CARDIOVERSION: SHX5443

## 2012-07-29 LAB — POCT I-STAT 3, VENOUS BLOOD GAS (G3P V)
Bicarbonate: 26 meq/L — ABNORMAL HIGH (ref 20.0–24.0)
O2 Saturation: 75 %
O2 Saturation: 76 %
TCO2: 27 mmol/L (ref 0–100)
pCO2, Ven: 47.5 mmHg (ref 45.0–50.0)
pCO2, Ven: 46.3 mmHg (ref 45.0–50.0)
pH, Ven: 7.358 — ABNORMAL HIGH (ref 7.250–7.300)
pO2, Ven: 42 mmHg (ref 30.0–45.0)
pO2, Ven: 43 mmHg (ref 30.0–45.0)

## 2012-07-29 LAB — CBC
HCT: 38.9 % (ref 36.0–46.0)
Hemoglobin: 12.8 g/dL (ref 12.0–15.0)
MCHC: 32.9 g/dL (ref 30.0–36.0)
RBC: 4.16 MIL/uL (ref 3.87–5.11)
WBC: 7.4 10*3/uL (ref 4.0–10.5)

## 2012-07-29 LAB — POCT I-STAT 3, ART BLOOD GAS (G3+)
Acid-Base Excess: 1 mmol/L (ref 0.0–2.0)
pO2, Arterial: 143 mmHg — ABNORMAL HIGH (ref 80.0–100.0)

## 2012-07-29 LAB — URINALYSIS, ROUTINE W REFLEX MICROSCOPIC
Ketones, ur: NEGATIVE mg/dL
Nitrite: NEGATIVE
Protein, ur: NEGATIVE mg/dL

## 2012-07-29 LAB — URINE MICROSCOPIC-ADD ON

## 2012-07-29 LAB — HEPARIN LEVEL (UNFRACTIONATED): Heparin Unfractionated: 0.5 IU/mL (ref 0.30–0.70)

## 2012-07-29 LAB — POCT ACTIVATED CLOTTING TIME: Activated Clotting Time: 160 s

## 2012-07-29 SURGERY — LEFT AND RIGHT HEART CATHETERIZATION WITH CORONARY ANGIOGRAM
Anesthesia: LOCAL

## 2012-07-29 SURGERY — ECHOCARDIOGRAM, TRANSESOPHAGEAL
Anesthesia: Moderate Sedation

## 2012-07-29 MED ORDER — MIDAZOLAM HCL 5 MG/ML IJ SOLN
INTRAMUSCULAR | Status: AC
  Filled 2012-07-29: qty 2

## 2012-07-29 MED ORDER — DIPHENHYDRAMINE HCL 50 MG/ML IJ SOLN
INTRAMUSCULAR | Status: AC
  Filled 2012-07-29: qty 1

## 2012-07-29 MED ORDER — HEPARIN (PORCINE) IN NACL 2-0.9 UNIT/ML-% IJ SOLN
INTRAMUSCULAR | Status: AC
  Filled 2012-07-29: qty 2000

## 2012-07-29 MED ORDER — ONDANSETRON HCL 4 MG/2ML IJ SOLN: 4.0000 mg | Freq: Four times a day (QID) | INTRAMUSCULAR | Status: DC | PRN

## 2012-07-29 MED ORDER — FENTANYL CITRATE 0.05 MG/ML IJ SOLN
INTRAMUSCULAR | Status: AC
  Filled 2012-07-29: qty 2

## 2012-07-29 MED ORDER — LIDOCAINE HCL (PF) 1 % IJ SOLN
INTRAMUSCULAR | Status: AC
  Filled 2012-07-29: qty 30

## 2012-07-29 MED ORDER — ACETAMINOPHEN 325 MG PO TABS: 650.0000 mg | ORAL_TABLET | ORAL | Status: DC | PRN

## 2012-07-29 MED ORDER — SODIUM CHLORIDE 0.9 % IV SOLN: INTRAVENOUS | Status: AC

## 2012-07-29 MED ORDER — HEPARIN (PORCINE) IN NACL 100-0.45 UNIT/ML-% IJ SOLN
1000.0000 [IU]/h | INTRAMUSCULAR | Status: DC
  Administered 2012-07-29 – 2012-08-03 (×6): 1000 [IU]/h via INTRAVENOUS
  Filled 2012-07-29 (×12): qty 250

## 2012-07-29 MED ORDER — FENTANYL CITRATE 0.05 MG/ML IJ SOLN
INTRAMUSCULAR | Status: DC | PRN
  Administered 2012-07-29: 25 ug via INTRAVENOUS

## 2012-07-29 MED ORDER — BUTAMBEN-TETRACAINE-BENZOCAINE 2-2-14 % EX AERO
INHALATION_SPRAY | CUTANEOUS | Status: DC | PRN
  Administered 2012-07-29: 2 via TOPICAL

## 2012-07-29 MED ORDER — MIDAZOLAM HCL 10 MG/2ML IJ SOLN
INTRAMUSCULAR | Status: DC | PRN
  Administered 2012-07-29 (×2): 2 mg via INTRAVENOUS

## 2012-07-29 NOTE — CV Procedure (Signed)
  Cardiac Catheterization Procedure Note  Name: Tammy Walter MRN: [id number] DOB: 1945/06/13  Procedure: Right Heart Cath, Left Heart Cath, Selective Coronary Angiography, LV angiography  Indication:  MVR/AVR.  Severe aortic valve stenosis by TTE  Procedural Details: The right groin was prepped, draped, and anesthetized with 1% lidocaine. Using the modified Seldinger technique a 4 French sheath was placed in the right femoral artery and a 7 French sheath was placed in the right femoral vein. A Swan-Ganz catheter was used for the right heart catheterization. Standard protocol was followed for recording of right heart pressures and sampling of oxygen saturations. Fick cardiac output was calculated. Standard Judkins catheters were used for selective coronary angiography. There were no immediate procedural complications. The patient was transferred to the post catheterization recovery area for further monitoring.  Procedural Findings:  Hemodynamics:               RA 10    RV 37/2    PA 33/14 mean 25    PCWP mean 16    LV NA    AO 132/56   Oxygen saturations:    PA 76    AO 99   Cardiac Output (Fick) 5.8                               Cardiac Index (Fick) 3.2   Coronary angiography:  Coronary dominance: right  Left mainstem: Normal  Left anterior descending (LAD): Wraps the apex.  Normal.  D1 moderate sized and normal  Left circumflex (LCx): AV groove vessel large and normal.  OM large and normal.  PL moderate size and normal  Right coronary artery (RCA): Large and normal. PDA and PL moderate sized an normal.  Left ventriculography: No left ventriculogram. We did not cross with the mechanical AVR.  We did multiple fluoroscopy images of the AVR there is symmetric valve opening.  However, the degree of opening of both leaflets seems to be reduced.    Final Conclusions:  No obstructive CAD.  By fluoroscopy the mechanical AVR (bileaflet tilting disc) seems to have symmetric opening of  the leaflets.  However, we cannot exclude a symmetric obstruction to AV opening.  Normal coronaries.  Normal right heart pressures.   Recommendations:  The case was reviewed with Dr. Eden Emms.  We will further review with [facility].     Rollene Rotunda 07/29/2012, 11:53 AM

## 2012-07-29 NOTE — Interval H&P Note (Signed)
History and Physical Interval Note:  07/29/2012 11:51 AM  Tammy Walter  has presented today for surgery, with the diagnosis of cad  The various methods of treatment have been discussed with the patient and family. After consideration of risks, benefits and other options for treatment, the patient has consented to  Procedure(s): LEFT AND RIGHT HEART CATHETERIZATION WITH CORONARY ANGIOGRAM (N/A) as a surgical intervention .  The patient's history has been reviewed, patient examined, no change in status, stable for surgery.  I have reviewed the patient's chart and labs.  Questions were answered to the patient's satisfaction.     Rollene Rotunda

## 2012-07-29 NOTE — CV Procedure (Signed)
See full TEE report in [employer].  Brian Crenshaw  

## 2012-07-29 NOTE — Progress Notes (Signed)
ANTICOAGULATION CONSULT NOTE - Follow Up Consult  Pharmacy Consult for Heparin Indication: Heparin bridging in patient with history of AVR/MVR  Heparin Dosing Weight: 68 kg  Labs:  Recent Labs  07/28/12 0822 07/28/12 1146 07/28/12 1853 07/29/12 0640  HGB  --  13.9  --  12.8  HCT  --  42.0  --  38.9  PLT  --  135*  --  143*  LABPROT  --  15.8*  --   --   INR 1.3 1.29  --   --   HEPARINUNFRC  --   --  0.62 0.50  CREATININE  --  0.51  --   --    Lab Results  Component Value Date   INR 1.29 07/28/2012   INR 1.3 07/28/2012   INR 3.0 07/22/2012    Medications:   Heparin infusion of 1000 units/hr stopped for Cardiac Cath.  Assessment:  Heparin infusion stopped prior to Cardiac Cath.  Previously Heparin infusion therapeutic at 1000 units/hr.  Heparin level 0.5.  S/p Cardiac Cath procedure.  Heparin is to be restarted 6 hours after Sheath pulled.    [facility] to review case.  Goal of Therapy:   Heparin level 0.3-0.7 units/ml   Plan:  Restart Heparin at previous rate 6 hours after Sheath pull.  Check Heparin level in 6 hours.   Stramoski, Deetta Perla.D 07/29/2012, 1:59 PM

## 2012-07-29 NOTE — Progress Notes (Signed)
Unable to retrieve some vital sign data from equipment that does not import into EPIC. Pt vital were monitored per policy, as was rt groin and distal pulses.

## 2012-07-29 NOTE — Procedures (Signed)
On admission in Endoscopy patient memory is noted to have large gaps in memory .Patient can not  State year ,does not know where she is except to say this is a hospital.Patient stated that her husband would come later today.Patient's next of kin is her son Paper chart requested. Says nothing about a spouse. Patient's son contacted . Telephone consent obtained from son for TEE and for a cardiac cath later today. Consents signed by patient removed from chart as they are invalated.

## 2012-07-29 NOTE — Progress Notes (Signed)
Pt found sitting 90 degrees with husband at bedside. RN put pt flat and reeducated pt and family to stay flat. Groin and distal pulses fine. Door left open for optimal viewing.

## 2012-07-29 NOTE — Progress Notes (Signed)
TCTS BRIEF PROGRESS NOTE   Patient seen and examined, chart and cath reviewed. Full consult to follow. Patient's husband has gone home. I will try to meet with the patient and her husband tomorrow afternoon.  Anquanette Bahner H 07/29/2012 6:18 PM

## 2012-07-29 NOTE — Progress Notes (Signed)
Utilization Review Completed.Dowell, Deborah T3/08/2012  

## 2012-07-29 NOTE — H&P (View-Only) (Signed)
Patient ID: Tammy Walter, female   DOB: 04/01/1946, 67 y.o.   MRN: [id number]    Subjective:  Denies SSCP, palpitations or Dyspnea   Objective:  Filed Vitals:   07/28/12 1420 07/28/12 1423 07/28/12 2030 07/29/12 0416  BP: 134/53 123/62 166/61 131/67  Pulse: 71 68 65 50  Temp:  98.5 F (36.9 C) 98.2 F (36.8 C) 97.9 F (36.6 C)  TempSrc:  Oral Oral Oral  Resp:   18 18  Height:      Weight:    148 lb 13 oz (67.5 kg)  SpO2:  100% 97% 98%    Intake/Output from previous day:  Intake/Output Summary (Last 24 hours) at 07/29/12 0641 Last data filed at 07/29/12 0049  Gross per 24 hour  Intake 760.83 ml  Output   1525 ml  Net -764.17 ml    Physical Exam: Affect appropriate Healthy:  appears stated age HEENT: normal Neck supple with no adenopathy JVP normal no bruits no thyromegaly Lungs clear with no wheezing and good diaphragmatic motion Heart:  S1/S2 AS murmur, no rub, gallop or click PMI normal Abdomen: benighn, BS positve, no tenderness, no AAA no bruit.  No HSM or HJR Distal pulses intact with no bruits No edema Neuro non-focal Skin warm and dry No muscular weakness   Lab Results: Basic Metabolic Panel:  Recent Labs  07/28/12 1146  NA 139  K 4.6  CL 103  CO2 30  GLUCOSE 90  BUN 13  CREATININE 0.51  CALCIUM 9.3   Liver Function Tests: No results found for this basename: AST, ALT, ALKPHOS, BILITOT, PROT, ALBUMIN,  in the last 72 hours No results found for this basename: LIPASE, AMYLASE,  in the last 72 hours CBC:  Recent Labs  07/28/12 1146  WBC 8.6  HGB 13.9  HCT 42.0  MCV 93.8  PLT 135*   Imaging: No results found.  Cardiac Studies:  ECG: NSR LVH   Telemetry: NSR no arrhythmia   Echo:  Mean gradient 71 ? AVR leaflet sticking MVR ok EF normal  Medications:   . aspirin EC  81 mg Oral Once   Followed by  . [START ON 07/30/2012] aspirin EC  81 mg Oral Daily  . B-complex with vitamin C  1 tablet Oral Daily  . digoxin  0.125 mg Oral  Daily  . donepezil  10 mg Oral QHS  . folic acid-pyridoxine-cyancobalamin  1 tablet Oral Daily  . memantine  10 mg Oral BID  . multivitamin with minerals  1 tablet Oral Daily  . omega-3 acid ethyl esters  1 g Oral Daily  . sodium chloride  3 mL Intravenous Q12H  . sodium chloride  3 mL Intravenous Q12H  . sodium chloride  3 mL Intravenous Q12H  . vitamin C  500 mg Oral TID     . sodium chloride 20 mL/hr at 07/28/12 1340  . heparin 1,000 Units/hr (07/29/12 0639)    Assessment/Plan:  AVR:  For TEE and cath today Please fluoro valve  Mean gradient very high and suspect pannus or thrombus on one leaflet  MVR:  Functioning well but 18 years ol Anticoagulation: On heparin Dementia:  Continue namenda and aricept patient is compensated and functional  Shiva Karis 07/29/2012, 6:41 AM     

## 2012-07-29 NOTE — Progress Notes (Signed)
Patient ID: Tammy Walter, female   DOB: [birth year], 67 y.o.   MRN: [id number]    Subjective:  Denies SSCP, palpitations or Dyspnea   Objective:  Filed Vitals:   07/28/12 1420 07/28/12 1423 07/28/12 2030 07/29/12 0416  BP: 134/53 123/62 166/61 131/67  Pulse: 71 68 65 50  Temp:  98.5 F (36.9 C) 98.2 F (36.8 C) 97.9 F (36.6 C)  TempSrc:  Oral Oral Oral  Resp:   18 18  Height:      Weight:    148 lb 13 oz (67.5 kg)  SpO2:  100% 97% 98%    Intake/Output from previous day:  Intake/Output Summary (Last 24 hours) at 07/29/12 0641 Last data filed at 07/29/12 0049  Gross per 24 hour  Intake 760.83 ml  Output   1525 ml  Net -764.17 ml    Physical Exam: Affect appropriate Healthy:  appears stated age HEENT: normal Neck supple with no adenopathy JVP normal no bruits no thyromegaly Lungs clear with no wheezing and good diaphragmatic motion Heart:  S1/S2 AS murmur, no rub, gallop or click PMI normal Abdomen: benighn, BS positve, no tenderness, no AAA no bruit.  No HSM or HJR Distal pulses intact with no bruits No edema Neuro non-focal Skin warm and dry No muscular weakness   Lab Results: Basic Metabolic Panel:  Recent Labs  40/98/11 1146  NA 139  K 4.6  CL 103  CO2 30  GLUCOSE 90  BUN 13  CREATININE 0.51  CALCIUM 9.3   Liver Function Tests: No results found for this basename: AST, ALT, ALKPHOS, BILITOT, PROT, ALBUMIN,  in the last 72 hours No results found for this basename: LIPASE, AMYLASE,  in the last 72 hours CBC:  Recent Labs  07/28/12 1146  WBC 8.6  HGB 13.9  HCT 42.0  MCV 93.8  PLT 135*   Imaging: No results found.  Cardiac Studies:  ECG: NSR LVH   Telemetry: NSR no arrhythmia   Echo:  Mean gradient 71 ? AVR leaflet sticking MVR ok EF normal  Medications:   . aspirin EC  81 mg Oral Once   Followed by  . [START ON 07/30/2012] aspirin EC  81 mg Oral Daily  . B-complex with vitamin C  1 tablet Oral Daily  . digoxin  0.125 mg Oral  Daily  . donepezil  10 mg Oral QHS  . folic acid-pyridoxine-cyancobalamin  1 tablet Oral Daily  . memantine  10 mg Oral BID  . multivitamin with minerals  1 tablet Oral Daily  . omega-3 acid ethyl esters  1 g Oral Daily  . sodium chloride  3 mL Intravenous Q12H  . sodium chloride  3 mL Intravenous Q12H  . sodium chloride  3 mL Intravenous Q12H  . vitamin C  500 mg Oral TID     . sodium chloride 20 mL/hr at 07/28/12 1340  . heparin 1,000 Units/hr (07/29/12 9147)    Assessment/Plan:  AVR:  For TEE and cath today Please fluoro valve  Mean gradient very high and suspect pannus or thrombus on one leaflet  MVR:  Functioning well but 18 years ol Anticoagulation: On heparin Dementia:  Continue namenda and aricept patient is compensated and functional  [name] 07/29/2012, 6:41 AM

## 2012-07-29 NOTE — Progress Notes (Signed)
Tammy Walter returned from Cath lab and TEE at 13:35. She was confused, alert, pain free. Her husband thought she was going home, and asking me why test was done. Cardiology PA paged to update pt and family.

## 2012-07-29 NOTE — Interval H&P Note (Signed)
History and Physical Interval Note:  07/29/2012 9:41 AM  Tammy Walter  has presented today for surgery, with the diagnosis of valve isn't working properly, a-fib  The various methods of treatment have been discussed with the patient and family. After consideration of risks, benefits and other options for treatment, the patient has consented to  Procedure(s): TRANSESOPHAGEAL ECHOCARDIOGRAM (TEE) (N/A) as a surgical intervention .  The patient's history has been reviewed, patient examined, no change in status, stable for surgery.  I have reviewed the patient's chart and labs.  Questions were answered to the patient's satisfaction.     Olga Millers

## 2012-07-29 NOTE — Progress Notes (Signed)
Echocardiogram Echocardiogram Transesophageal has been performed.  BROWN, CALEBA 07/29/2012, 10:25 AM

## 2012-07-30 ENCOUNTER — Inpatient Hospital Stay (HOSPITAL_COMMUNITY): Payer: Medicare Other

## 2012-07-30 ENCOUNTER — Encounter (HOSPITAL_COMMUNITY): Payer: Self-pay | Admitting: Cardiology

## 2012-07-30 DIAGNOSIS — I359 Nonrheumatic aortic valve disorder, unspecified: Secondary | ICD-10-CM

## 2012-07-30 DIAGNOSIS — I059 Rheumatic mitral valve disease, unspecified: Secondary | ICD-10-CM

## 2012-07-30 DIAGNOSIS — T8209XA Other mechanical complication of heart valve prosthesis, initial encounter: Secondary | ICD-10-CM

## 2012-07-30 HISTORY — PX: CARDIAC CATHETERIZATION: SHX172

## 2012-07-30 HISTORY — DX: Other mechanical complication of heart valve prosthesis, initial encounter: T82.09XA

## 2012-07-30 LAB — HEMOGLOBIN A1C: Mean Plasma Glucose: 108 mg/dL (ref <117)

## 2012-07-30 LAB — COMPREHENSIVE METABOLIC PANEL
BUN: 10 mg/dL (ref 6–23)
Calcium: 9 mg/dL (ref 8.4–10.5)
Creatinine, Ser: 0.5 mg/dL (ref 0.50–1.10)
GFR calc Af Amer: >90 mL/min (ref >90)
Glucose, Bld: 96 mg/dL (ref 70–99)
Total Protein: 6.2 g/dL (ref 6.0–8.3)

## 2012-07-30 LAB — CBC
Hemoglobin: 12.1 g/dL (ref 12.0–15.0)
MCH: 30.9 pg (ref 26.0–34.0)
MCHC: 34.2 g/dL (ref 30.0–36.0)
RDW: 15.1 % (ref 11.5–15.5)

## 2012-07-30 LAB — PRO B NATRIURETIC PEPTIDE: Pro B Natriuretic peptide (BNP): 500.3 pg/mL — ABNORMAL HIGH (ref 0–125)

## 2012-07-30 LAB — PREALBUMIN: Prealbumin: 22.6 mg/dL (ref 17.0–34.0)

## 2012-07-30 LAB — TSH: TSH: 1.349 u[IU]/mL (ref 0.350–4.500)

## 2012-07-30 LAB — HEPARIN LEVEL (UNFRACTIONATED): Heparin Unfractionated: 0.4 IU/mL (ref 0.30–0.70)

## 2012-07-30 MED ORDER — WARFARIN - PHARMACIST DOSING INPATIENT: Freq: Every day | Status: DC

## 2012-07-30 MED ORDER — WARFARIN SODIUM 10 MG PO TABS
10.0000 mg | ORAL_TABLET | ORAL | Status: AC
  Administered 2012-07-31: 10 mg via ORAL
  Filled 2012-07-30 (×2): qty 1

## 2012-07-30 NOTE — Progress Notes (Signed)
Received fax from [facility] for pt's medical records when she had her first AVR. Will put information in chart.

## 2012-07-30 NOTE — Progress Notes (Signed)
ANTICOAGULATION CONSULT NOTE - Follow Up Consult  Pharmacy Consult for Coumadin Indication: AVR/MVR  No Known Allergies  Patient Measurements: Height: 5\' 5"  (165.1 cm) Weight: 148 lb 13 oz (67.5 kg) IBW/kg (Calculated) : 57  Vital Signs: Temp: 98.1 F (36.7 C) (03/05 2007) Temp src: Oral (03/05 2007) BP: 125/58 mmHg (03/05 2007) Pulse Rate: 52 (03/05 2007)  Labs:  Recent Labs  07/28/12 0822  07/28/12 1146  07/29/12 0640 07/30/12 0222 07/30/12 1035  HGB  --   < > 13.9  --  12.8 12.1  --   HCT  --   --  42.0  --  38.9 35.4*  --   PLT  --   --  135*  --  143* 146*  --   LABPROT  --   --  15.8*  --   --   --   --   INR 1.3  --  1.29  --   --   --   --   HEPARINUNFRC  --   --   --   < > 0.50 0.40 0.43  CREATININE  --   --  0.51  --   --  0.50  --   < > = values in this interval not displayed.  Estimated Creatinine Clearance: 60.6 ml/min (by C-G formula based on Cr of 0.5).  Assessment: 67yom known to pharmacy for heparin bridging now to resume coumadin for AVR/MVR as she is deemed not a candidate for re-do aortic valve replacement at this time. Last INR on 3/3 is 1.29 - coumadin has been on hold since 2/28 pending possible surgery.  Home dose: 5mg  daily except 2.5mg  on Monday and Friday.  Dr. Eden Emms has ordered coumadin 10mg  x 1 tonight.  Goal of Therapy:  INR 2.5-3.5 Monitor platelets by anticoagulation protocol: Yes   Plan:  1) Coumadin 10mg  x 1 tonight per MD 2) Daily INR 3) Pharmacy to resume dosing 3/6  Fredrik Rigger 07/30/2012,10:25 PM

## 2012-07-30 NOTE — Progress Notes (Signed)
Patient ID: Virgia Land Cossin, female   DOB: 1945/09/07, 67 y.o.   MRN: [id number]    Subjective:  Denies SSCP, palpitations or Dyspnea   Objective:  Filed Vitals:   07/29/12 1630 07/29/12 1700 07/29/12 2010 07/30/12 0531  BP: 110/55 154/80 133/75 123/59  Pulse:   71 57  Temp:   98.1 F (36.7 C) 98 F (36.7 C)  TempSrc:   Oral Oral  Resp:   18 18  Height:      Weight:      SpO2:   96% 98%    Intake/Output from previous day:  Intake/Output Summary (Last 24 hours) at 07/30/12 0745 Last data filed at 07/30/12 0700  Gross per 24 hour  Intake 1004.83 ml  Output   1000 ml  Net   4.83 ml    Physical Exam: Affect appropriate Middle aged female HEENT: normal Neck supple with no adenopathy JVP normal no bruits no thyromegaly Lungs clear with no wheezing and good diaphragmatic motion Heart:  S1/S2 AS murmur, no rub, gallop or click PMI normal Abdomen: benighn, BS positve, no tenderness, no AAA no bruit.  No HSM or HJR Distal pulses intact with no bruits No edema Neuro non-focal poor memory Skin warm and dry No muscular weakness   Lab Results: Basic Metabolic Panel:  Recent Labs  78/46/96 1146 07/30/12 0222  NA 139 140  K 4.6 3.9  CL 103 106  CO2 30 25  GLUCOSE 90 96  BUN 13 10  CREATININE 0.51 0.50  CALCIUM 9.3 9.0   Liver Function Tests:  Recent Labs  07/30/12 0222  AST 29  ALT 15  ALKPHOS 39  BILITOT 0.6  PROT 6.2  ALBUMIN 3.4*   No results found for this basename: LIPASE, AMYLASE,  in the last 72 hours CBC:  Recent Labs  07/28/12 1146 07/29/12 0640  WBC 8.6 7.4  HGB 13.9 12.8  HCT 42.0 38.9  MCV 93.8 93.5  PLT 135* 143*   Imaging: Dg Chest 2 View  07/30/2012  *RADIOLOGY REPORT*  Clinical Data: History of CHF  CHEST - 2 VIEW  Comparison: None.  Findings: Cardiomegaly.  Increased interstitial markings without frank interstitial edema.  No pleural effusion or pneumothorax.  Degenerative changes of the visualized thoracolumbar spine.   IMPRESSION: Cardiomegaly.  No frank interstitial edema.   Original Report Authenticated By: Charline Bills, M.D.     Cardiac Studies:  ECG: NSR LVH   Telemetry: NSR no arrhythmia   Echo:  Mean gradient 71 ? AVR leaflet sticking MVR ok EF normal  Medications:   . aspirin EC  81 mg Oral Once   Followed by  . aspirin EC  81 mg Oral Daily  . B-complex with vitamin C  1 tablet Oral Daily  . digoxin  0.125 mg Oral Daily  . donepezil  10 mg Oral QHS  . folic acid-pyridoxine-cyancobalamin  1 tablet Oral Daily  . memantine  10 mg Oral BID  . multivitamin with minerals  1 tablet Oral Daily  . omega-3 acid ethyl esters  1 g Oral Daily  . sodium chloride  3 mL Intravenous Q12H  . sodium chloride  3 mL Intravenous Q12H  . vitamin C  500 mg Oral TID     . heparin 1,000 Units/hr (07/29/12 1848)    Assessment/Plan:  AVR:  Reviewed TEE and cath films with Dr Hochrein/Cooper/Mclean.  Aortic valve appears to not open fully accounting for gradient without significant AR.  Despite dementia she is relatively  young and I think AVR is inidicated. Surgeons indicate they would leave MVR in place.  Dr Cornelius Moras to talk with  Family this afternoon.  NO CAD on cath.  Will leave on heparin for now and not resume coumadin in case AVR planned this admission MVR:  Functioning well but 18 years ol Anticoagulation: On heparin Dementia:  Continue namenda and aricept patient is compensated and functional  [name] 07/30/2012, 7:45 AM

## 2012-07-30 NOTE — Care Management Note (Unsigned)
    Page 1 of 1   07/30/2012     2:26:26 PM   CARE MANAGEMENT NOTE 07/30/2012  Patient:  Tammy Walter, Tammy Walter   Account Number:  [id number]  Date Initiated:  07/30/2012  Documentation initiated by:  AMERSON,JULIE  Subjective/Objective Assessment:   PT ADM WITH AORTIC STENOSIS; TCTS EVAL PENDING.  PTA, PT RESIDES AT HOME WITH HUSBAND.  HX OF DEMENTIA; PT ON MULTIPLE DEMENTIA MEDS.     Action/Plan:   WILL FOLLOW FOR HOME NEEDS AS PT PROGRESSES.   Anticipated DC Date:  07/31/2012   Anticipated DC Plan:  HOME/SELF CARE      DC Planning Services  CM consult      Choice offered to / List presented to:             Status of service:  In process, will continue to follow Medicare Important Message given?   (If response is "NO", the following Medicare IM given date fields will be blank) Date Medicare IM given:   Date Additional Medicare IM given:    Discharge Disposition:    Per UR Regulation:  Reviewed for med. necessity/level of care/duration of stay  If discussed at Long Length of Stay Meetings, dates discussed:    Comments:

## 2012-07-30 NOTE — Progress Notes (Signed)
ANTICOAGULATION CONSULT NOTE - Follow Up Consult  Pharmacy Consult for heparin Indication: AVR/MVR, awaiting TCTS consult  Labs:  Recent Labs  07/28/12 0822 07/28/12 1146 07/28/12 1853 07/29/12 0640 07/30/12 0222  HGB  --  13.9  --  12.8  --   HCT  --  42.0  --  38.9  --   PLT  --  135*  --  143*  --   LABPROT  --  15.8*  --   --   --   INR 1.3 1.29  --   --   --   HEPARINUNFRC  --   --  0.62 0.50 0.40  CREATININE  --  0.51  --   --  0.50    Assessment/Plan:  67yo female therapeutic on heparin after resumed post cath, now awaiting full TCTS consult for possible surgical intervention for AV opening.  Will continue gtt at current rate and confirm stable with additional level.  Vernard Gambles, PharmD, BCPS  07/30/2012,3:35 AM

## 2012-07-30 NOTE — Progress Notes (Signed)
ANTICOAGULATION CONSULT NOTE - Follow Up Consult  Pharmacy Consult for Heparin bridging Indication: AVR/MVR, atrial fibrillation   Heparin Dosing Weight: 68 kg  Labs:  Recent Labs  07/28/12 0822  07/28/12 1146  07/29/12 0640 07/30/12 0222 07/30/12 1035  HGB  --   < > 13.9  --  12.8 12.1  --   HCT  --   --  42.0  --  38.9 35.4*  --   PLT  --   --  135*  --  143* 146*  --   LABPROT  --   --  15.8*  --   --   --   --   INR 1.3  --  1.29  --   --   --   --   HEPARINUNFRC  --   --   --   < > 0.50 0.40 0.43  CREATININE  --   --  0.51  --   --  0.50  --    Lab Results  Component Value Date   INR 1.29 07/28/2012   INR 1.3 07/28/2012   INR 3.0 07/22/2012    Medications:  Infusions:  . heparin 1,000 Units/hr (07/29/12 1848)  . [DISCONTINUED] heparin 1,000 Units/hr (07/29/12 1610)   Assessment:  67 y/o female on chronic anticoagulation for hx of atrial fibrillation and AVR/MVR awaiting surgical consult for possible intervention for AV opening.  Heparin level within therapeutic range. No bleeding complications noted   Goal of Therapy:  Heparin level 0.3 - 0.7   Plan:  Continue Heparin at 1000 units/hr.  Daily Heparin level and CBC while on Heparin.    Stramoski, Deetta Perla.D 07/30/2012, 12:08 PM

## 2012-07-30 NOTE — Consult Note (Addendum)
CARDIOTHORACIC SURGERY CONSULTATION REPORT  PCP is Ralene Ok, MD Referring Provider is [name], MD   Reason for consultation:  Prosthetic valve dysfunction  HPI:  Patient is a 67 year old married white female from [facility] with history rheumatic heart disease for which she underwent aortic and mitral valve replacement in 1996 using St. Jude mechanical valves. The patient has done well since then with no recurrence of congestive heart failure, although she has developed significant dementia. Recently the patient has been followed up by Dr. Eden Emms and transthoracic echocardiograms have demonstrated severely elevated gradients across the aortic valve suggestive of aortic stenosis.  Transthoracic echocardiogram performed in 2011 demonstrated peak velocity across the aortic valve a 4.5 m/s. Repeat transthoracic echocardiogram performed last month demonstrates peak velocity increased to 5.4 m/s. Left ventricular systolic function remains normal.  Both the aortic and mitral valve prostheses appear to be functioning normally otherwise with only physiologic regurgitation. The patient was brought in to the hospital for transesophageal echocardiogram and diagnostic cardiac catheterization. The prosthetic aortic valve was not well visualized on transesophageal echocardiogram, but on fluoroscopy there is suggestion that the aortic valve prosthesis is not opening completely. Cardiothoracic surgical consultation and requested.  The patient is married and lives with her husband. She remains physically active, and according to the husband they walk together 1-1/2 miles daily at a brisk pace. The patient specifically denies any symptoms of shortness of breath either with activity or at rest. Her husband notes that her exercise tolerance is quite good and he has not changed in recent years. The patient does suffer from dementia which has been somewhat gradual onset over the last several years. Last  summer the patient's dementia declined to the point where she was hospitalized in [facility] and underwent a formal neurological and psychological evaluation. Her cognitive decline has stabilized somewhat on medical therapy since then. She has considerable problems with short and long-term memory but remains otherwise somewhat functional and in good spirits.  Past Medical History  Diagnosis Date  . Atrial fibrillation   . Hypercholesterolemia   . Long term (current) use of anticoagulants     coumadin therapy  . Aortic valve disorder     a. s/p mech avr in 1986 - [facility];  b. 06/2012 Echo: EF 55-60%, Critical AS, mean grad , Peak grad , mvr functioning well.  . Mitral valve disorder     a. s/p mech mvr in 1986 - [facility];  b. 06/2012 Echo: nl fxnng mvr.  . Dementia   . S/P aortic valve replacement with metallic valve 08/06/1994    St Jude mechanical prosthesis, size 21mm - Dr Pricilla Holm at [facility]  . S/P mitral valve replacement with metallic valve 08/06/1994    St Jude mechanical prosthesis, size 29mm - Dr Pricilla Holm at [facility]  . 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 Aortic valve replacement  08/06/1994    21mm St Jude   . Mitral valve replacement  08/06/1994    29mm St Jude  . Abdominal hysterectomy    . Tee without cardioversion N/A 07/29/2012    Procedure: TRANSESOPHAGEAL ECHOCARDIOGRAM (TEE);  Surgeon: Lewayne Bunting, MD;  Location: [facility] ENDOSCOPY;  Service: Cardiovascular;  Laterality: N/A;    History reviewed. No pertinent family history.  History   Social History  . Marital Status: Married    Spouse Name: N/A  Number of Children: N/A  . Years of Education: N/A   Occupational History  . Not on file.   Social History Main Topics  . Smoking status: Former Games developer  . Smokeless tobacco: Not on file  . Alcohol Use: No  . Drug Use: No  . Sexually Active: Not on file    Other Topics Concern  . Not on file   Social History Narrative   Pt walks her [name], [name] on a regular basis    Prior to Admission medications   Medication Sig Start Date End Date Taking? Authorizing Provider  aspirin 81 MG EC tablet Take 81 mg by mouth daily.     Yes Historical Provider, MD  b complex vitamins tablet Take 1 tablet by mouth daily.     Yes Historical Provider, MD  Calcium Carbonate (CALCIUM 500 PO) Take 1 tablet by mouth daily.     Yes Historical Provider, MD  Dietary Management Product (AXONA PO) Take 1 packet by mouth daily.   Yes Historical Provider, MD  digoxin (LANOXIN) 0.25 MG tablet Take 0.125 mg by mouth daily.   Yes Historical Provider, MD  donepezil (ARICEPT) 10 MG tablet Take 10 mg by mouth at bedtime.   Yes Historical Provider, MD  fish oil-omega-3 fatty acids 1000 MG capsule Take 1 g by mouth daily.   Yes Historical Provider, MD  l-methylfolate-b2-b6-b12 (CEREFOLIN) 10-26-48-5 MG TABS Take 1 tablet by mouth daily.   Yes Historical Provider, MD  memantine (NAMENDA) 10 MG tablet Take 10 mg by mouth 2 (two) times daily.    Yes Historical Provider, MD  Multiple Vitamin (MULTIVITAMIN WITH MINERALS) TABS Take 1 tablet by mouth daily.   Yes Historical Provider, MD  vitamin C (ASCORBIC ACID) 500 MG tablet Take 500 mg by mouth 3 (three) times daily.   Yes Historical Provider, MD  vitamin E 400 UNIT capsule Take 400 Units by mouth daily.   Yes Historical Provider, MD  warfarin (COUMADIN) 5 MG tablet Take 2.5-5 mg by mouth daily. 2.5mg  on M, F 5mg  on S, T, W, Th, Sat   Yes Historical Provider, MD    Current Facility-Administered Medications  Medication Dose Route Frequency Provider Last Rate Last Dose  . 0.9 %  sodium chloride infusion  250 mL Intravenous PRN Wendall Stade, MD      . acetaminophen (TYLENOL) tablet 650 mg  650 mg Oral Q4H PRN Rollene Rotunda, MD      . aspirin EC tablet 81 mg  81 mg Oral Once Wendall Stade, MD       Followed by  .  aspirin EC tablet 81 mg  81 mg Oral Daily Wendall Stade, MD   81 mg at 07/30/12 1225  . B-complex with vitamin C tablet 1 tablet  1 tablet Oral Daily Wendall Stade, MD   1 tablet at 07/30/12 1000  . digoxin (LANOXIN) tablet 0.125 mg  0.125 mg Oral Daily Wendall Stade, MD   0.125 mg at 07/30/12 1224  . donepezil (ARICEPT) tablet 10 mg  10 mg Oral QHS Ok Anis, NP   10 mg at 07/29/12 2138  . folic acid-pyridoxine-cyancobalamin (FOLTX) 2.5-25-2 MG per tablet 1 tablet  1 tablet Oral Daily Wendall Stade, MD   1 tablet at 07/30/12 1224  . heparin ADULT infusion 100 units/mL (25000 units/250 mL)  1,000 Units/hr Intravenous Continuous Wendall Stade, MD 10 mL/hr at 07/30/12 1502 1,000 Units/hr at 07/30/12 1502  . memantine (NAMENDA)  tablet 10 mg  10 mg Oral BID Ok Anis, NP   10 mg at 07/30/12 1224  . multivitamin with minerals tablet 1 tablet  1 tablet Oral Daily Ok Anis, NP   1 tablet at 07/30/12 1224  . omega-3 acid ethyl esters (LOVAZA) capsule 1 g  1 g Oral Daily Wendall Stade, MD   1 g at 07/30/12 1224  . ondansetron (ZOFRAN) injection 4 mg  4 mg Intravenous Q6H PRN Rollene Rotunda, MD      . sodium chloride 0.9 % injection 3 mL  3 mL Intravenous Q12H Wendall Stade, MD   3 mL at 07/30/12 1226  . sodium chloride 0.9 % injection 3 mL  3 mL Intravenous Q12H Wendall Stade, MD   3 mL at 07/29/12 2139  . sodium chloride 0.9 % injection 3 mL  3 mL Intravenous PRN Wendall Stade, MD      . vitamin C (ASCORBIC ACID) tablet 500 mg  500 mg Oral TID Ok Anis, NP   500 mg at 07/30/12 1516    No Known Allergies    Review of Systems:   General:  normal appetite, normal energy, no weight gain, no weight loss, no fever  Cardiac:  no chest pain with exertion, no chest pain at rest, no SOB with no exertion, no resting SOB, no PND, no orthopnea, no palpitations, no arrhythmia, no atrial fibrillation, no LE edema, no dizzy spells, no syncope  Respiratory:  no  shortness of breath, no home oxygen, no productive cough, no dry cough, no bronchitis, no wheezing, no hemoptysis, no asthma, no pain with inspiration or cough, no sleep apnea, no CPAP at night  GI:   no difficulty swallowing, no reflux, no frequent heartburn, no hiatal hernia, no abdominal pain, no constipation, no diarrhea, no hematochezia, no hematemesis, no melena  GU:   no dysuria,  no frequency, no urinary tract infection, no hematuria, no kidney stones, no kidney disease  Vascular:  no pain suggestive of claudication, no pain in feet, no leg cramps, no varicose veins, no DVT, no non-healing foot ulcer  Neuro:   no stroke, no TIA's, no seizures, no headaches, no temporary blindness one eye,  no slurred speech, no peripheral neuropathy, no chronic pain, no instability of gait, + severe memory/cognitive dysfunction  Musculoskeletal: no arthritis, no joint swelling, no myalgias, no difficulty walking, no mobility   Skin:   no rash, no itching, no skin infections, no pressure sores or ulcerations  Psych:   no anxiety, no depression, no nervousness, no unusual recent stress  Eyes:   no blurry vision, no floaters, no recent vision changes,  wears glasses or contacts  ENT:   no hearing loss, no loose or painful teeth, no dentures  Hematologic:  + easy bruising, no abnormal bleeding, no clotting disorder, no frequent epistaxis, no complications with coumadin therapy  Endocrine:  no diabetes, does not check CBG's at home     Physical Exam:   BP 101/66  Pulse 76  Temp(Src) 99.4 F (37.4 C) (Oral)  Resp 18  Ht 5\' 5"  (1.651 m)  Wt 67.5 kg (148 lb 13 oz)  BMI 24.76 kg/m2  SpO2 98%  General:    well-appearing  HEENT:  Unremarkable   Neck:   no JVD, no bruits, no adenopathy   Chest:   clear to auscultation, symmetrical breath sounds, no wheezes, no rhonchi   CV:   RRR, grade III/VI harsh systolic murmur  Abdomen:  soft, non-tender, no masses   Extremities:  warm, well-perfused, pulses    Rectal/GU  Deferred  Neuro:   Grossly non-focal and symmetrical throughout  Skin:   Clean and dry, no rashes, no breakdown   Diagnostic Tests:  Transthoracic Echocardiography  Patient: Aanya, Haynes MR #: [id number] Study Date: 07/15/2009 Gender: F Age: 65 Height: 167.6cm Weight: 72.6kg BSA: 1.28m^2 Pt. Status: Room:  ADMITTING [name], MD, [facility] ATTENDING [name], MD, [facility] ORDERING [name], MD, [facility] SONOGRAPHER Luvenia Redden, RDCS PERFORMING [facility], [facility] cc:  -------------------------------------------------------------------- Indications: 424.1 Aortic valve disorders.  -------------------------------------------------------------------- History: PMH: Acquired from the patient and from the patient's chart. Atrial fibrillation. Aortic valve disease. Mitral valve disease. Risk factors: Dyslipidemia.  -------------------------------------------------------------------- Study Conclusions  - Left ventricle: The cavity size was normal. Wall thickness was normal. Systolic function was normal. The estimated ejection fraction was in the range of 55% to 60%. Wall motion was normal; there were no regional wall motion abnormalities. - Aortic valve: Mild regurgitation. - Mitral valve: A mechanical prosthesis was present. Mild regurgitation. Valve area by pressure half-time: 2.14cm^2. - Left atrium: The atrium was severely dilated. - Right atrium: The atrium was moderately dilated. - Tricuspid valve: Mild-moderate regurgitation. Impressions:  - Aortic valve not well visualized but gradient severely elevated (4.6 m/s, mean gradient 51 mmHg).  -------------------------------------------------------------------- Labs, prior tests, procedures, and surgery: Mitral valve replacement with a mechanical valve. Aortic valve replacement.  Transthoracic echocardiography. M-mode, complete 2D, spectral Doppler, and color Doppler. Height: Height: 167.6cm. Height:  66in. Weight: Weight: 72.6kg. Weight: 159.7lb. Body mass index: BMI: 25.8kg/m^2. Body surface area: BSA: 1.27m^2. Blood pressure: 144/77. Patient status: Outpatient. Location: [facility]  --------------------------------------------------------------------  -------------------------------------------------------------------- Left ventricle: The cavity size was normal. Wall thickness was normal. Systolic function was normal. The estimated ejection fraction was in the range of 55% to 60%. Wall motion was normal; there were no regional wall motion abnormalities.  -------------------------------------------------------------------- Aortic valve: Poorly visualized. Doppler: Mild regurgitation. Mean gradient: 49mm Hg (S). Peak gradient: 81mm Hg (S).  -------------------------------------------------------------------- Aorta: Aortic root: The aortic root was normal in size. -------------------------------------------------------------------- Mitral valve: A mechanical prosthesis was present. Mobility was not restricted. Doppler: Mild regurgitation. Valve area by pressure half-time: 2.14cm^2. Indexed valve area by pressure half-time: 1.18cm^2/m^2. Mean gradient: 8mm Hg (D). Peak gradient: 21mm Hg (D).  -------------------------------------------------------------------- Left atrium: The atrium was severely dilated.  -------------------------------------------------------------------- Right ventricle: The cavity size was normal. Systolic function was normal.  -------------------------------------------------------------------- Pulmonic valve: Doppler: Transvalvular velocity was within the normal range. There was no evidence for stenosis. Trivial regurgitation.  -------------------------------------------------------------------- Tricuspid valve: Doppler: Transvalvular velocity was within the normal range. Mild-moderate  regurgitation.  -------------------------------------------------------------------- Pulmonary artery: Systolic pressure was within the normal range.  -------------------------------------------------------------------- Right atrium: The atrium was moderately dilated.  -------------------------------------------------------------------- Pericardium: There was no pericardial effusion.  -------------------------------------------------------------------- Systemic veins: Inferior vena cava: The vessel was normal in size.  --------------------------------------------------------------------  2D measurements Normal Doppler measurements Normal Left ventricle Aortic valve LVID ED, 53.2 mm 43-52 Peak vel, S 449 cm/s ------ chord, PLAX Mean vel, S 322 cm/s ------ LVID ES, 30.9 mm 23-38 VTI, S 106 cm ------ chord, PLAX Mean 49 mm Hg ------ FS, chord, 42 % >29 gradient, S PLAX Peak 81 mm Hg ------ LVPW, ED 8.55 mm ------ gradient, S IVS/LVPW 0.98 <1.3 Mitral valve ratio, ED Peak E vel 176 cm/s ------ Ventricular septum Mean vel, D 127 cm/s ------ IVS, ED 8.4 mm ------ Deceleration 335 ms 150-23  Aorta time 0 Root diam, ED 27 mm ------ Pressure 103 ms ------ Left atrium half-time AP dim 55 mm ------ Mean 8 mm Hg ------ AP dim index 3.02 cm/m^2 <2.2 gradient, D Peak 21 mm Hg ------ gradient, D Area (PHT) 2.1 cm^2 ------ 4 Area index 1.1 cm^2/m^2 ------ (PHT) 8 Annulus VTI 49. cm ------ 1  -------------------------------------------------------------------- Prepared and Electronically Authenticated by  Olga Millers, MD, [facility] 2011-02-18T11:40:[id number]    Transthoracic Echocardiography  Patient: Arnetta, Odeh MR #: [id number] Study Date: 07/22/2012 Gender: F Age: 58 Height: 165.1cm Weight: 66.7kg BSA: 1.63m^2 Pt. Status: Room:  ORDERING Gweneth Dimitri ATTENDING Olga Millers PERFORMING [facility] SONOGRAPHER Junious Dresser,  RDCS cc:  ------------------------------------------------------------ LV EF: 55% - 60%  ------------------------------------------------------------ Indications: 424.0 Mitral valve disease. 424.1 Aortic valve disorders. 427.31 Atrial Fibrillation.  ------------------------------------------------------------ History: PMH: Acquired from the patient and from the patient's chart. Atrial fibrillation. Aortic valve disease, post prosthetic replacement. Mitral valve disease, post prosthetic replacement. Risk factors: Dyslipidemia.  ------------------------------------------------------------ Study Conclusions  - Left ventricle: The cavity size was normal. Wall thickness was increased in a pattern of mild LVH. Systolic function was normal. The estimated ejection fraction was in the range of 55% to 60%. Wall motion was normal; there were no regional wall motion abnormalities. - Aortic valve: A mechanical prosthesis was present. There was critical stenosis. Mean gradient: 72mm Hg (S). Peak gradient: Hg (S). - Mitral valve: A mechanical prosthesis was present. Mean gradient: 7mm Hg (D). Peak gradient: 22mm Hg (D). Valve area by pressure half-time: 2.02cm^2. - Left atrium: The atrium was moderately dilated. - Right atrium: The atrium was moderately dilated. - Pulmonary arteries: Systolic pressure was mildly increased. PA peak pressure: 38mm Hg (S). Impressions:  - MV prosthesis appears to be functioning well; AV prosthesis not well visualized but gradients are severely elevated with mean gradient of 72 mmHg (? valve dysfunction vs mismatch); suggest TEE to further assess.  ------------------------------------------------------------ Labs, prior tests, procedures, and surgery: Echocardiography (2011). EF was 60%. Mitral valve: peak gradient of 21mm Hg and mean gradient of 8mm Hg. Aortic valve: peak gradient of Hg and mean gradient of 51mm Hg.  Valve surgery (1994).  Mitral valve replacement with a [employer] mechanical valve. Aortic valve replacement with a [employer]. Jude Medical mechanical valve. Transthoracic echocardiography. M-mode, complete 2D, spectral Doppler, and color Doppler. Height: Height: 165.1cm. Height: 65in. Weight: Weight: 66.7kg. Weight: 146.7lb. Body mass index: BMI: 24.5kg/m^2. Body surface area: BSA: 1.62m^2. Blood pressure: 160/85. Patient status: Outpatient. Location: [place] [facility]  ------------------------------------------------------------  ------------------------------------------------------------ Left ventricle: The cavity size was normal. Wall thickness was increased in a pattern of mild LVH. Systolic function was normal. The estimated ejection fraction was in the range of 55% to 60%. Wall motion was normal; there were no regional wall motion abnormalities. The study was not technically sufficient to allow evaluation of LV diastolic dysfunction due to atrial fibrillation.  ------------------------------------------------------------ Aortic valve: A mechanical prosthesis was present. Doppler: There was critical stenosis. Physiologic regurgitation. VTI ratio of LVOT to aortic valve: 0.12. Peak velocity ratio of LVOT to aortic valve: 0.12. Mean gradient: 72mm Hg (S). Peak gradient: Hg (S).  ------------------------------------------------------------ Aorta: Aortic root: The aortic root was normal in size.  ------------------------------------------------------------ Mitral valve: A mechanical prosthesis was present. Doppler: No significant regurgitation. Valve area by pressure half-time: 2.02cm^2. Indexed valve area by pressure half-time: 1.16cm^2/m^2. Mean gradient: 7mm Hg (D). Peak gradient: 22mm Hg (D).  ------------------------------------------------------------ Left  atrium: The atrium was moderately dilated.  ------------------------------------------------------------ Right ventricle: The  cavity size was normal. Systolic function was normal.  ------------------------------------------------------------ Pulmonic valve: Structurally normal valve. Cusp separation was normal. Doppler: Transvalvular velocity was within the normal range. Mild regurgitation.  ------------------------------------------------------------ Tricuspid valve: Structurally normal valve. Leaflet separation was normal. Doppler: Transvalvular velocity was within the normal range. Mild regurgitation.  ------------------------------------------------------------ Pulmonary artery: Systolic pressure was mildly increased.  ------------------------------------------------------------ Right atrium: The atrium was moderately dilated.  ------------------------------------------------------------ Pericardium: There was no pericardial effusion.  ------------------------------------------------------------ Systemic veins: Inferior vena cava: The vessel was normal in size; the respirophasic diameter changes were in the normal range (= 50%); findings are consistent with normal central venous pressure.  ------------------------------------------------------------  2D measurements Normal Doppler measurements Norma Left ventricle l LVID ED, 49.3 mm 43-52 Main pulmonary artery chord, Pressure, 38 mm Hg =30 PLAX S LVID ES, 34.3 mm 23-38 LVOT chord, Peak vel, 63. cm/s ----- PLAX S 4 FS, chord, 30 % >29 VTI, S 16. cm ----- PLAX 8 LVPW, ED 12.2 mm ------ Aortic valve IVS/LVPW 0.9 <1.3 Peak vel, 539 cm/s ----- ratio, ED S Ventricular septum Mean vel, 399 cm/s ----- IVS, ED 11 mm ------ S Aorta VTI, S 144 cm ----- Root diam, 26 mm ------ Mean 72 mm Hg ----- ED gradient, Left atrium S AP dim 53 mm ------ Peak 116 mm Hg ----- AP dim 3.05 cm/m^2 <2.2 gradient, index S VTI ratio 0.1 ----- LVOT/AV 2 Peak vel 0.1 ----- ratio, 2 LVOT/AV Mitral valve Mean vel, 113 cm/s ----- D Pressure 109 ms  ----- half-time Mean 7 mm Hg ----- gradient, D Peak 22 mm Hg ----- gradient, D Area (PHT) 2.0 cm^2 ----- 2 Area index 1.1 cm^2/m^2 ----- (PHT) 6 Annulus 48. cm ----- VTI 2 Tricuspid valve Regurg 287 cm/s ----- peak vel Peak RV-RA 33 mm Hg ----- gradient, S Systemic veins Estimated 5 mm Hg ----- CVP Right ventricle Pressure, 38 mm Hg <30 S  ------------------------------------------------------------ Prepared and Electronically Authenticated by  Olga Millers 2014-02-25T12:12:[id number]    Transesophageal Echocardiography  Patient: Ameliyah, Sarno MR #: [id number] Study Date: 07/29/2012 Gender: F Age: 73 Height: 165.1cm Weight: 67.3kg BSA: 1.56m^2 Pt. Status: Room: 2025  ADMITTING [name] ATTENDING Jani Gravel REFERRING [name] PERFORMING Olga Millers SONOGRAPHER Nolon Rod, RDCS cc:  ------------------------------------------------------------ LV EF: 55% - 60%  ------------------------------------------------------------ Indications: Aortic stenosis 424.1.  ------------------------------------------------------------ Study Conclusions  - Left ventricle: Systolic function was normal. The estimated ejection fraction was in the range of 55% to 60%. Wall motion was normal; there were no regional wall motion abnormalities. - Mitral valve: A mechanical prosthesis was present. Mild regurgitation. - Left atrium: The atrium was moderately dilated. - Right atrium: The atrium was mildly dilated. - Atrial septum: No defect or patent foramen ovale was identified. - Tricuspid valve: No evidence of vegetation. Recommendations: Prosthetic MV appears to functioning normally (mean gradient of 6 mmHg; mild MR); prosthetic aortic valve not visualized; turbulence not in aortic root. Transesophageal echocardiography. 2D and color Doppler. Height: Height: 165.1cm. Height: 65in. Weight: Weight: 67.3kg. Weight: 148lb. Body  mass index: BMI: 24.7kg/m^2. Body surface area: BSA: 1.94m^2. Blood pressure: 136/82. Patient status: Inpatient. Location: Endoscopy.  ------------------------------------------------------------  ------------------------------------------------------------ Left ventricle: Systolic function was normal. The estimated ejection fraction was in the range of 55% to 60%. Wall motion was normal; there were no regional wall motion abnormalities.  ------------------------------------------------------------ Aortic valve: Poorly visualized.  ------------------------------------------------------------ Aorta: Descending aorta: The descending aorta had mild to  moderate diffuse atherosclerotic disease.  ------------------------------------------------------------ Mitral valve: A mechanical prosthesis was present. Doppler: Mild regurgitation. Valve area by pressure half-time: 3.55cm^2. Indexed valve area by pressure half-time: 2.01cm^2/m^2. Mean gradient: 6mm Hg (D). Peak gradient: 21mm Hg (D).  ------------------------------------------------------------ Left atrium: The atrium was moderately dilated.  ------------------------------------------------------------ Atrial septum: No defect or patent foramen ovale was identified.  ------------------------------------------------------------ Right ventricle: The cavity size was normal. Systolic function was normal.  ------------------------------------------------------------ Pulmonic valve: Doppler: Trivial regurgitation.  ------------------------------------------------------------ Tricuspid valve: Structurally normal valve. Leaflet separation was normal. No evidence of vegetation. Doppler: Mild regurgitation.  ------------------------------------------------------------ Right atrium: The atrium was mildly dilated.  ------------------------------------------------------------ Pericardium: There was no pericardial  effusion.  ------------------------------------------------------------  Doppler measurements Norma l Mitral valve Mean vel, 109 cm/s ----- D Pressure 62 ms ----- half-time Mean 6 mm Hg ----- gradient, D Peak 21 mm Hg ----- gradient, D Area 3.5 cm^2 ----- (PHT) 5 Area 2.0 cm^2/m^2 ----- index 1 (PHT) Annulus 35 cm ----- VTI  ------------------------------------------------------------ Prepared and Electronically Authenticated by  Olga Millers 2014-03-04T12:40:38.030   Cardiac Catheterization Procedure Note   Name: Chrislyn Seedorf Rabine  MRN: [id number]  DOB: 03-Feb-1946  Procedure: Right Heart Cath, Left Heart Cath, Selective Coronary Angiography, LV angiography  Indication: MVR/AVR. Severe aortic valve stenosis by TTE  Procedural Details: The right groin was prepped, draped, and anesthetized with 1% lidocaine. Using the modified Seldinger technique a 4 French sheath was placed in the right femoral artery and a 7 French sheath was placed in the right femoral vein. A Swan-Ganz catheter was used for the right heart catheterization. Standard protocol was followed for recording of right heart pressures and sampling of oxygen saturations. Fick cardiac output was calculated. Standard Judkins catheters were used for selective coronary angiography. There were no immediate procedural complications. The patient was transferred to the post catheterization recovery area for further monitoring.  Procedural Findings:  Hemodynamics:  RA 10  RV 37/2  PA 33/14 mean 25  PCWP mean 16  LV NA  AO 132/56  Oxygen saturations:  PA 76  AO 99  Cardiac Output (Fick) 5.8 Cardiac Index (Fick) 3.2  Coronary angiography:  Coronary dominance: right  Left mainstem: Normal  Left anterior descending (LAD): Wraps the apex. Normal. D1 moderate sized and normal  Left circumflex (LCx): AV groove vessel large and normal. OM large and normal. PL moderate size and normal  Right coronary artery (RCA): Large  and normal. PDA and PL moderate sized an normal.  Left ventriculography: No left ventriculogram. We did not cross with the mechanical AVR. We did multiple fluoroscopy images of the AVR there is symmetric valve opening. However, the degree of opening of both leaflets seems to be reduced.  Final Conclusions: No obstructive CAD. By fluoroscopy the mechanical AVR (bileaflet tilting disc) seems to have symmetric opening of the leaflets. However, we cannot exclude a symmetric obstruction to AV opening. Normal coronaries. Normal right heart pressures.  Recommendations: The case was reviewed with Dr. Eden Emms. We will further review with [facility].  Rollene Rotunda  07/29/2012, 11:53 AM    Impression:  The patient has prosthetic valve dysfunction involving 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  Transthoracic echocardiograms suggest the presence of significant aortic stenosis without aortic regurgitation. Transesophageal echocardiogram confirms normal function of the mitral prosthesis and normal closure of the aortic prosthesis with no significant aortic insufficiency. Left ventricular systolic function remains normal. Most importantly,  the patient remains completely asymptomatic. She remains physically active and walks daily with her husband at a reasonably brisk pace and despite this the patient has not developed any symptoms of exertional shortness of breath nor any change in her exercise tolerance. Finally, the patient suffers from significant dementia. Redo aortic valve replacement would come with considerable risk for the possibility of further accelerating her cognitive decline.  This might contribute to significant further decline in her functional status. Although she certainly is at risk for the development of symptoms of congestive heart failure, I do not feel that redo aortic valve replacement is  indicated at this time.  Plan:  I recommend continued followup with repeat transthoracic echocardiograms periodically. I discussed matters at length patient and her husband. He understands that there is a possibility that the patient may develop symptomatic congestive heart failure, and if that were to occur a decision would need to be made whether or not to consider this patient a candidate for redo aortic valve replacement. The patient's husband also understands that there is perhaps a chance that the patient could develop acute congestive heart failure if one of the leaflets of the mechanical valve were to become impinged causing significant aortic insufficiency. All of his questions been addressed. It would be delighted to see this patient again in the future if she develops symptoms of congestive heart failure or further questions arise.     Salvatore Decent. Cornelius Moras, MD 07/30/2012 7:21 PM  I spent in excess of 90 minutes of time directly involved in the conduct of this consultation.

## 2012-07-31 LAB — CBC
Hemoglobin: 12.4 g/dL (ref 12.0–15.0)
MCV: 93 fL (ref 78.0–100.0)
Platelets: 140 10*3/uL — ABNORMAL LOW (ref 150–400)
RBC: 4 MIL/uL (ref 3.87–5.11)
WBC: 7.7 10*3/uL (ref 4.0–10.5)

## 2012-07-31 LAB — HEPARIN LEVEL (UNFRACTIONATED): Heparin Unfractionated: 0.53 IU/mL (ref 0.30–0.70)

## 2012-07-31 LAB — PROTIME-INR: INR: 1.23 (ref 0.00–1.49)

## 2012-07-31 MED ORDER — WARFARIN VIDEO: 1.0000 | Freq: Once | Status: DC

## 2012-07-31 MED ORDER — ASPIRIN EC 81 MG PO TBEC
81.0000 mg | DELAYED_RELEASE_TABLET | Freq: Every day | ORAL | Status: DC
  Administered 2012-08-01: 81 mg via ORAL
  Filled 2012-07-31 (×2): qty 1

## 2012-07-31 MED ORDER — WARFARIN SODIUM 7.5 MG PO TABS
7.5000 mg | ORAL_TABLET | Freq: Once | ORAL | Status: AC
Start: 2012-07-31 — End: 2012-07-31
  Administered 2012-07-31: 7.5 mg via ORAL
  Filled 2012-07-31: qty 1

## 2012-07-31 MED ORDER — COUMADIN BOOK
1.0000 | Freq: Once | Status: AC
  Administered 2012-07-31: 1
  Filled 2012-07-31: qty 1

## 2012-07-31 NOTE — Progress Notes (Signed)
ANTICOAGULATION CONSULT NOTE - Follow Up Consult  Pharmacy Consult for Coumadin with Heparin bridging Indication: AVR/MVR  Heparin Dosing Weight: 68 kg  Labs:  Recent Labs  07/28/12 1146 07/29/12 0640 07/30/12 0222 07/30/12 1035 07/31/12 0515  HGB 13.9 12.8 12.1  --  12.4  HCT 42.0 38.9 35.4*  --  37.2  PLT 135* 143* 146*  --  140*  LABPROT 15.8*  --   --   --  15.3*  INR 1.29  --   --   --  1.23  HEPARINUNFRC  --  0.50 0.40 0.43 0.53  CREATININE 0.51  --  0.50  --   --    Lab Results  Component Value Date   INR 1.23 07/31/2012   INR 1.29 07/28/2012   INR 1.3 07/28/2012   Medications:  Prescriptions prior to admission  Medication Sig Dispense Refill  . aspirin 81 MG EC tablet Take 81 mg by mouth daily.        Marland Kitchen b complex vitamins tablet Take 1 tablet by mouth daily.        . Calcium Carbonate (CALCIUM 500 PO) Take 1 tablet by mouth daily.        . Dietary Management Product (AXONA PO) Take 1 packet by mouth daily.      . digoxin (LANOXIN) 0.25 MG tablet Take 0.125 mg by mouth daily.      Marland Kitchen donepezil (ARICEPT) 10 MG tablet Take 10 mg by mouth at bedtime.      . fish oil-omega-3 fatty acids 1000 MG capsule Take 1 g by mouth daily.      Marland Kitchen l-methylfolate-b2-b6-b12 (CEREFOLIN) 10-26-48-5 MG TABS Take 1 tablet by mouth daily.      . memantine (NAMENDA) 10 MG tablet Take 10 mg by mouth 2 (two) times daily.       . Multiple Vitamin (MULTIVITAMIN WITH MINERALS) TABS Take 1 tablet by mouth daily.      . vitamin C (ASCORBIC ACID) 500 MG tablet Take 500 mg by mouth 3 (three) times daily.      . vitamin E 400 UNIT capsule Take 400 Units by mouth daily.      Marland Kitchen warfarin (COUMADIN) 5 MG tablet Take 2.5-5 mg by mouth daily. 2.5mg  on M, F 5mg  on S, T, W, Th, Sat       Infusions:  . heparin 1,000 Units/hr (07/30/12 1502)   Assessment:  67 y/o female on chronic anticoagulation for hx of atrial fibrillation and AVR/MVR.    Following surgical consult, Coumadin restarted with Heparin  bridging.  Home Coumadin doses 5 mg daily except 2.5 mg M, F.  Heparin level 0.53 with Heparin infusing at 1000 units/hr.  Goal of Therapy:  Target INR 2.5-3.5   Heparin level 0.3-0.7 units/ml   Plan:  Coumadin 7.5 mg po today.  Continue Heparin at 1000 units/hr  Daily Heparin level, INR, CBC.    Stramoski, Deetta Perla.D 07/31/2012, 9:36 AM

## 2012-07-31 NOTE — Progress Notes (Signed)
Patient Name: Tammy Walter Date of Encounter: 07/31/2012   Principal Problem:   Prosthetic valve dysfunction Active Problems:   HYPERCHOLESTEROLEMIA   Aortic valve disorders   Atrial fibrillation   S/P aortic valve replacement with metallic valve   S/P mitral valve replacement with metallic valve   SUBJECTIVE  No chest pain or sob.  INR 1.23 This AM.  CURRENT MEDS . aspirin EC  81 mg Oral Once   Followed by  . aspirin EC  81 mg Oral Daily  . B-complex with vitamin C  1 tablet Oral Daily  . digoxin  0.125 mg Oral Daily  . donepezil  10 mg Oral QHS  . folic acid-pyridoxine-cyancobalamin  1 tablet Oral Daily  . memantine  10 mg Oral BID  . multivitamin with minerals  1 tablet Oral Daily  . omega-3 acid ethyl esters  1 g Oral Daily  . sodium chloride  3 mL Intravenous Q12H  . sodium chloride  3 mL Intravenous Q12H  . vitamin C  500 mg Oral TID  . warfarin  7.5 mg Oral ONCE-1800  . [START ON 08/01/2012] warfarin  1 each Does not apply Once  . Warfarin - Pharmacist Dosing Inpatient   Does not apply q1800   OBJECTIVE  Filed Vitals:   07/30/12 1353 07/30/12 2007 07/31/12 0353 07/31/12 0936  BP: 101/66 125/58 120/60 111/54  Pulse: 76 52 52 58  Temp: 99.4 F (37.4 C) 98.1 F (36.7 C) 97.6 F (36.4 C)   TempSrc: Oral Oral Oral   Resp: 18 18 18    Height:      Weight:      SpO2: 98% 98% 99%     Intake/Output Summary (Last 24 hours) at 07/31/12 1136 Last data filed at 07/31/12 1054  Gross per 24 hour  Intake    726 ml  Output    900 ml  Net   -174 ml   Filed Weights   07/28/12 1110 07/29/12 0416  Weight: 149 lb 4 oz (67.7 kg) 148 lb 13 oz (67.5 kg)   PHYSICAL EXAM  General: Pleasant, NAD. Neuro: Alert and oriented to self/place. Moves all extremities spontaneously. Psych: Normal affect. HEENT:  Normal  Neck: Supple without bruits or JVD. Lungs:  Resp regular and unlabored, CTA. Heart: RRR no s3, s4.  2/6 sem heard throughout, mech s1/s2. Abdomen: Soft,  non-tender, non-distended, BS + x 4.  Extremities: No clubbing, cyanosis or edema. DP/PT/Radials 2+ and equal bilaterally.  R groin mildly ecchymotic w/o bleeding/bruit/hematoma.  Accessory Clinical Findings  CBC  Recent Labs  07/30/12 0222 07/31/12 0515  WBC 9.1 7.7  HGB 12.1 12.4  HCT 35.4* 37.2  MCV 90.5 93.0  PLT 146* 140*   Basic Metabolic Panel  Recent Labs  07/28/12 1146 07/30/12 0222  NA 139 140  K 4.6 3.9  CL 103 106  CO2 30 25  GLUCOSE 90 96  BUN 13 10  CREATININE 0.51 0.50  CALCIUM 9.3 9.0   Liver Function Tests  Recent Labs  07/30/12 0222  AST 29  ALT 15  ALKPHOS 39  BILITOT 0.6  PROT 6.2  ALBUMIN 3.4*   Hemoglobin A1C  Recent Labs  07/30/12 0222  HGBA1C 5.4   Thyroid Function Tests  Recent Labs  07/30/12 0222  TSH 1.349    TELE  afib  Radiology/Studies  Dg Chest 2 View  07/30/2012  *RADIOLOGY REPORT*  Clinical Data: History of CHF  CHEST - 2 VIEW  Comparison: None.  Findings: Cardiomegaly.  Increased interstitial markings without frank interstitial edema.  No pleural effusion or pneumothorax.  Degenerative changes of the visualized thoracolumbar spine.  IMPRESSION: Cardiomegaly.  No frank interstitial edema.   Original Report Authenticated By: Charline Bills, M.D.    ASSESSMENT AND PLAN  1.  Mechanical Ao Valve dysfxn:  S/p TEE and cath this admission.  Seen by TCTS.  Cont conservative care w/ plan for outpt surveillance echoes given current asymptomatic state.  INR only 1.23 this AM.  Cont heparin bridge while INR rises.  2.  Afib:  Rate controlled on digoxin.  Cont coumadin.  D/C asa.  3.  Dementia:  Cont home meds.  Signed, Nicolasa Ducking NP  Patient seen, examined. Available data reviewed. Agree with findings, assessment, and plan as outlined by Ward Givens, NP. Lab, echo, cath data reviewed. Surgical consult note of Dr Cornelius Moras reviewed. Exam reveals pleasant woman in NAD. Heart is RRR with grade 2/6 systolic murmur  throughout, mechanical S1/S2. Continue bridge with heparin while INR trends upward. I would continue ASA at low dose as well as there is clearly some valve dysfunction and increased risk of thrombosis. She otherwise appears stable.  Tonny Bollman, M.D. 07/31/2012 3:34 PM

## 2012-08-01 LAB — CBC
Platelets: 142 10*3/uL — ABNORMAL LOW (ref 150–400)
RBC: 3.9 MIL/uL (ref 3.87–5.11)
RDW: 15 % (ref 11.5–15.5)
WBC: 9.9 10*3/uL (ref 4.0–10.5)

## 2012-08-01 LAB — PROTIME-INR
INR: 1.39 (ref 0.00–1.49)
Prothrombin Time: 16.7 seconds — ABNORMAL HIGH (ref 11.6–15.2)

## 2012-08-01 MED ORDER — WARFARIN SODIUM 7.5 MG PO TABS
7.5000 mg | ORAL_TABLET | Freq: Once | ORAL | Status: AC
  Administered 2012-08-01: 7.5 mg via ORAL
  Filled 2012-08-01: qty 1

## 2012-08-01 NOTE — Progress Notes (Signed)
Patient ID: Tammy Walter, female   DOB: 02/20/1946, 67 y.o.   MRN: [id number]    Subjective:  Denies SSCP, palpitations or Dyspnea   Objective:  Filed Vitals:   07/31/12 0936 07/31/12 1402 07/31/12 2024 08/01/12 0410  BP: 111/54 127/61 122/53 124/57  Pulse: 58 63 62 62  Temp:  97.9 F (36.6 C) 98.2 F (36.8 C) 98.1 F (36.7 C)  TempSrc:  Oral Oral Oral  Resp:  18 18 18   Height:      Weight:      SpO2:  98% 98% 99%    Intake/Output from previous day:  Intake/Output Summary (Last 24 hours) at 08/01/12 0802 Last data filed at 07/31/12 2207  Gross per 24 hour  Intake    726 ml  Output    400 ml  Net    326 ml    Physical Exam: Affect appropriate Middle aged female HEENT: normal Neck supple with no adenopathy JVP normal no bruits no thyromegaly Lungs clear with no wheezing and good diaphragmatic motion Heart:  S1/S2 AS murmur, no rub, gallop or click PMI normal Abdomen: benighn, BS positve, no tenderness, no AAA no bruit.  No HSM or HJR Distal pulses intact with no bruits No edema Neuro non-focal poor memory Skin warm and dry No muscular weakness   Lab Results: Basic Metabolic Panel:  Recent Labs  65/78/46 0222  NA 140  K 3.9  CL 106  CO2 25  GLUCOSE 96  BUN 10  CREATININE 0.50  CALCIUM 9.0   Liver Function Tests:  Recent Labs  07/30/12 0222  AST 29  ALT 15  ALKPHOS 39  BILITOT 0.6  PROT 6.2  ALBUMIN 3.4*   No results found for this basename: LIPASE, AMYLASE,  in the last 72 hours CBC:  Recent Labs  07/31/12 0515 08/01/12 0640  WBC 7.7 9.9  HGB 12.4 12.1  HCT 37.2 36.5  MCV 93.0 93.6  PLT 140* 142*   Imaging: No results found.  Cardiac Studies:  ECG: NSR LVH   Telemetry: NSR no arrhythmia   Echo:  Mean gradient 71 ? AVR leaflet sticking MVR ok EF normal  Medications:   . aspirin EC  81 mg Oral Daily  . B-complex with vitamin C  1 tablet Oral Daily  . digoxin  0.125 mg Oral Daily  . donepezil  10 mg Oral QHS  .  folic acid-pyridoxine-cyancobalamin  1 tablet Oral Daily  . memantine  10 mg Oral BID  . multivitamin with minerals  1 tablet Oral Daily  . omega-3 acid ethyl esters  1 g Oral Daily  . sodium chloride  3 mL Intravenous Q12H  . sodium chloride  3 mL Intravenous Q12H  . vitamin C  500 mg Oral TID  . warfarin  1 each Does not apply Once  . Warfarin - Pharmacist Dosing Inpatient   Does not apply q1800     . heparin 1,000 Units/hr (07/31/12 1615)    Assessment/Plan:  AVR:  Reviewed TEE and cath films with Dr Hochrein/Cooper/[facility]clean.  Aortic valve appears to not open fully accounting for gradient without significant AR.  Dr Cornelius Moras feels patient not a good surgical candidate and would wait for high risk surgery until paitent has CHF MVR:  Functioning well but 18 years ol Anticoagulation: On heparin Coumadin restarted D/C over weekend when INR over 2.0  Dementia:  Continue namenda and aricept patient is compensated and functional  [name] 08/01/2012, 8:02 AM

## 2012-08-01 NOTE — Progress Notes (Signed)
ANTICOAGULATION CONSULT NOTE - Follow Up Consult  Pharmacy Consult for Coumadin Indication: AVR/MVR  No Known Allergies  Patient Measurements: Height: 5\' 5"  (165.1 cm) Weight: 148 lb 13 oz (67.5 kg) IBW/kg (Calculated) : 57  Vital Signs: Temp: 98.1 F (36.7 C) (03/07 0410) Temp src: Oral (03/07 0410) BP: 124/57 mmHg (03/07 0410) Pulse Rate: 68 (03/07 0959)  Labs:  Recent Labs  07/30/12 0222 07/30/12 1035 07/31/12 0515 08/01/12 0640 08/01/12 1038  HGB 12.1  --  12.4 12.1  --   HCT 35.4*  --  37.2 36.5  --   PLT 146*  --  140* 142*  --   LABPROT  --   --  15.3*  --  16.7*  INR  --   --  1.23  --  1.39  HEPARINUNFRC 0.40 0.43 0.53 0.52  --   CREATININE 0.50  --   --   --   --     Estimated Creatinine Clearance: 60.6 ml/min (by C-G formula based on Cr of 0.5).  Assessment: 67yom known to pharmacy for heparin bridging now to resume coumadin for AVR/MVR as she is deemed not a candidate for re-do aortic valve replacement at this time. Day 2 of hep/coumadin overlap. INR today 1.39. No bleeding noted and CBC is stable. Platelets are slightly low at 142 but her baseline seems to be low as well. Heparin level is therapeutic (0.52).   Home dose: 5mg  daily except 2.5mg  on Monday and Friday.  Goal of Therapy:  INR 2.5-3.5 Monitor platelets by anticoagulation protocol: Yes   Plan:  1) Repeat Coumadin 7.5 mg x 1 2) Continue heparin at 1000 units/hr 3) Daily INR, HL  Thank you,  Brett Fairy, PharmD, BCPS 08/01/2012 11:38 AM

## 2012-08-02 DIAGNOSIS — M79609 Pain in unspecified limb: Secondary | ICD-10-CM

## 2012-08-02 LAB — HEPARIN LEVEL (UNFRACTIONATED): Heparin Unfractionated: 0.54 IU/mL (ref 0.30–0.70)

## 2012-08-02 LAB — CBC
MCHC: 33.3 g/dL (ref 30.0–36.0)
Platelets: 137 10*3/uL — ABNORMAL LOW (ref 150–400)
RDW: 15.1 % (ref 11.5–15.5)

## 2012-08-02 LAB — PROTIME-INR: Prothrombin Time: 21.6 seconds — ABNORMAL HIGH (ref 11.6–15.2)

## 2012-08-02 MED ORDER — WARFARIN SODIUM 5 MG PO TABS
5.0000 mg | ORAL_TABLET | Freq: Once | ORAL | Status: AC
  Administered 2012-08-02: 5 mg via ORAL
  Filled 2012-08-02: qty 1

## 2012-08-02 NOTE — Progress Notes (Signed)
VASCULAR LAB PRELIMINARY  PRELIMINARY  PRELIMINARY  PRELIMINARY  Right femoral artery duplex  completed.    Preliminary report:  Positive for what appears to be two separate pseudoaneurysms rising from the right common femoral artery. Difficult to image may possibly be a multichambered pseudoaneurysm. The larger chamber or pseudoaneurysm measures 2 cm x 1 cm  SLAUGHTER, [place], RVS 08/02/2012, 3:21 PM

## 2012-08-02 NOTE — Progress Notes (Addendum)
Subjective:  The patient feels well.  She has no complaints of any chest pain or shortness of breath.  She has a large hematoma in the right groin.  She has been able to walk in the head without difficulty. Objective:  Vital Signs in the last 24 hours: Temp:  [97.9 F (36.6 C)-98 F (36.7 C)] 97.9 F (36.6 C) (03/08 0503) Pulse Rate:  [52-70] 52 (03/08 0503) Resp:  [18-20] 20 (03/08 0503) BP: (122-130)/(49-60) 129/49 mmHg (03/08 0503) SpO2:  [97 %-99 %] 99 % (03/08 0503)  Intake/Output from previous day: 03/07 0701 - 03/08 0700 In: 840 [P.O.:240; I.V.:600] Out: 850 [Urine:850] Intake/Output from this shift:    . aspirin EC  81 mg Oral Daily  . B-complex with vitamin C  1 tablet Oral Daily  . digoxin  0.125 mg Oral Daily  . donepezil  10 mg Oral QHS  . folic acid-pyridoxine-cyancobalamin  1 tablet Oral Daily  . memantine  10 mg Oral BID  . multivitamin with minerals  1 tablet Oral Daily  . omega-3 acid ethyl esters  1 g Oral Daily  . sodium chloride  3 mL Intravenous Q12H  . sodium chloride  3 mL Intravenous Q12H  . vitamin C  500 mg Oral TID  . warfarin  1 each Does not apply Once  . Warfarin - Pharmacist Dosing Inpatient   Does not apply q1800   . heparin 1,000 Units/hr (08/01/12 2023)    Physical Exam: The patient appears to be in no distress.  Head and neck exam reveals that the pupils are equal and reactive.  The extraocular movements are full.  There is no scleral icterus.  Mouth and pharynx are benign.  No lymphadenopathy.  No carotid bruits.  The jugular venous pressure is normal.  Thyroid is not enlarged or tender.  Chest is clear to percussion and auscultation.  No rales or rhonchi.  Expansion of the chest is symmetrical.  Heart reveals grade 2/6 harsh systolic ejection murmur at the base and left sternal edge.  No diastolic murmur.  The abdomen is soft and nontender.  Bowel sounds are normoactive.  There is no hepatosplenomegaly or mass.  There are no  abdominal bruits.  She has a large right groin hematoma at the site of the previous cardiac catheterization.  There is a systolic bruit audible.  Extremities reveal no phlebitis or edema.  Pedal pulses are good.  There is no cyanosis or clubbing.  Neurologic exam is normal strength and no lateralizing weakness.  No sensory deficits.  She is very demented.  She cannot tell me where she gets her prothrombin times checked or where she had her open heart surgery.  Integument reveals no rash  Lab Results:  Recent Labs  08/01/12 0640 08/02/12 0528  WBC 9.9 8.8  HGB 12.1 11.9*  PLT 142* 137*   No results found for this basename: NA, K, CL, CO2, GLUCOSE, BUN, CREATININE,  in the last 72 hours No results found for this basename: TROPONINI, CK, MB,  in the last 72 hours Hepatic Function Panel No results found for this basename: PROT, ALBUMIN, AST, ALT, ALKPHOS, BILITOT, BILIDIR, IBILI,  in the last 72 hours No results found for this basename: CHOL,  in the last 72 hours No results found for this basename: PROTIME,  in the last 72 hours  Imaging: Imaging results have been reviewed  Cardiac Studies: Telemetry shows atrial fibrillation with controlled ventricular response   Assessment/Plan:  1. Critical AS: She  has been seen by TCTS  Dr. Cornelius Moras.  No repeat surgery planned at this time. 2. S/P AVR/MVR: Heparin bridging while Coumadin is being restarted 3. Dementia: Cont home meds.  4. Afib: Chronic, rate-controlled on digoxin. Coumadin being restarted /heparin bridge. 5. right groin hematoma.  Plan: We'll get ultrasound to be sure she does not have a pseudoaneurysm.  Limit activity today to bedrest and a bedside commode.  We will hold aspirin for now.   . LOS: 5 days    Cassell Clement 08/02/2012, 8:57 AM

## 2012-08-02 NOTE — Progress Notes (Signed)
ANTICOAGULATION CONSULT NOTE - Follow Up Consult  Pharmacy Consult for Coumadin with Heparin bridging Indication: atrial fibrillation , AVR/MVR  Heparin Dosing Weight: 68 kg  Labs:  Recent Labs  07/31/12 0515 08/01/12 0640 08/01/12 1038 08/02/12 0528  HGB 12.4 12.1  --  11.9*  HCT 37.2 36.5  --  35.7*  PLT 140* 142*  --  137*  LABPROT 15.3*  --  16.7* 21.6*  INR 1.23  --  1.39 1.96*  HEPARINUNFRC 0.53 0.52  --  0.54   Lab Results  Component Value Date   INR 1.96* 08/02/2012   INR 1.39 08/01/2012   INR 1.23 07/31/2012    Medications:  Scheduled:  . B-complex with vitamin C  1 tablet Oral Daily  . digoxin  0.125 mg Oral Daily  . donepezil  10 mg Oral QHS  . folic acid-pyridoxine-cyancobalamin  1 tablet Oral Daily  . memantine  10 mg Oral BID  . multivitamin with minerals  1 tablet Oral Daily  . omega-3 acid ethyl esters  1 g Oral Daily  . sodium chloride  3 mL Intravenous Q12H  . sodium chloride  3 mL Intravenous Q12H  . vitamin C  500 mg Oral TID  . [COMPLETED] warfarin  7.5 mg Oral ONCE-1800  . warfarin  1 each Does not apply Once  . Warfarin - Pharmacist Dosing Inpatient   Does not apply q1800  . [DISCONTINUED] aspirin EC  81 mg Oral Daily   Infusions:  . heparin 1,000 Units/hr (08/01/12 2023)   Assessment:  67 y/o female on chronic Coumadin for hx of atrial fibrillation, AVR/MVR .  Coumadin has been restarted following surgical evalulation with no surgery planned at this time.  INR below therapeutic goals.  Heparin level stable at 0.54  Right groin hematoma noted  H/H/P stable.  Goal of Therapy:  Target INR 2.5-3.5   Heparin level 0.3-0.7 units/ml   Plan:  Continue Heparin bridging at 1000 units/hour  Coumadin 5 mg today.  Monitor for bleeding complications, H/H/P.   Stramoski, Deetta Perla.D 08/02/2012, 11:53 AM

## 2012-08-02 NOTE — Progress Notes (Addendum)
Pseudoaneurysms noted on LE duplex. D/w Dr. Gala Romney. Will tentatively plan for compression to be scheduled for Monday 08/04/12, vascular tech aware, but we have also asked [facility] to evaluate (Dr. Darrick Penna). They will put note on chart tomorrow. If it is not compressible on Monday, Dr. Darrick Penna notes it may need to be injected. Dayna Dunn PA-C

## 2012-08-02 NOTE — Progress Notes (Signed)
Preliminary Arterial duplex results called to Lucile Crater, PA. Pt resting in bed at this time. No new orders given at this time. Will continue to monitor.

## 2012-08-03 DIAGNOSIS — I724 Aneurysm of artery of lower extremity: Secondary | ICD-10-CM

## 2012-08-03 LAB — HEPARIN LEVEL (UNFRACTIONATED): Heparin Unfractionated: 0.52 IU/mL (ref 0.30–0.70)

## 2012-08-03 LAB — CBC
MCH: 30.9 pg (ref 26.0–34.0)
MCHC: 33.1 g/dL (ref 30.0–36.0)
Platelets: 135 10*3/uL — ABNORMAL LOW (ref 150–400)
RDW: 15.2 % (ref 11.5–15.5)

## 2012-08-03 LAB — PROTIME-INR
INR: 2.04 — ABNORMAL HIGH (ref 0.00–1.49)
Prothrombin Time: 22.2 seconds — ABNORMAL HIGH (ref 11.6–15.2)

## 2012-08-03 MED ORDER — WARFARIN SODIUM 5 MG PO TABS
5.0000 mg | ORAL_TABLET | Freq: Once | ORAL | Status: AC
  Administered 2012-08-03: 5 mg via ORAL
  Filled 2012-08-03: qty 1

## 2012-08-03 NOTE — Progress Notes (Signed)
ANTICOAGULATION CONSULT NOTE - Follow Up Consult  Pharmacy Consult for Coumadin with Heparin bridging Indication: Afib, AVR/MVR  Heparin Dosing Weight: 68 kg  Labs:  Recent Labs  08/01/12 0640 08/01/12 1038 08/02/12 0528 08/03/12 0635  HGB 12.1  --  11.9* 12.0  HCT 36.5  --  35.7* 36.2  PLT 142*  --  137* 135*  LABPROT  --  16.7* 21.6* 22.2*  INR  --  1.39 1.96* 2.04*  HEPARINUNFRC 0.52  --  0.54 0.52   Lab Results  Component Value Date   INR 2.04* 08/03/2012   INR 1.96* 08/02/2012   INR 1.39 08/01/2012   Medications:  Scheduled:  . B-complex with vitamin C  1 tablet Oral Daily  . donepezil  10 mg Oral QHS  . folic acid-pyridoxine-cyancobalamin  1 tablet Oral Daily  . memantine  10 mg Oral BID  . multivitamin with minerals  1 tablet Oral Daily  . omega-3 acid ethyl esters  1 g Oral Daily  . sodium chloride  3 mL Intravenous Q12H  . sodium chloride  3 mL Intravenous Q12H  . vitamin C  500 mg Oral TID  . [COMPLETED] warfarin  5 mg Oral ONCE-1800  . warfarin  1 each Does not apply Once  . Warfarin - Pharmacist Dosing Inpatient   Does not apply q1800  . [DISCONTINUED] digoxin  0.125 mg Oral Daily   Infusions:  . heparin 1,000 Units/hr (08/02/12 1714)   Assessment:  67 y/o female on chronic Coumadin for hx of atrial fibrillation, AVR/MVR  INR below therapeutic goal of 2.5 - 3.5.  Heparin level remains stable at 0.52. No new bleeding complications identified. H/H/P stable   Goal of Therapy:  Heparin level 0.3-0.7 units/ml\  Target INR 2.5-3.5    Plan:  Continue Heparin bridging until INR closer to lower end of therapeutic goal of 2.5.  Repeat Coumadin 5 mg today.  Monitor for bleeding complications   Daily Heparin level, INR, CBC.    Stramoski, Deetta Perla.D 08/03/2012, 1:00 PM

## 2012-08-03 NOTE — Progress Notes (Signed)
Patient: Tammy Walter / Admit Date: 07/28/2012 / Date of Encounter: 08/03/2012, 7:35 AM   Subjective  "Can you remind me again why I am in the hospital?" No chest pain or SOB. Denies groin pain. Found to have 2 pseudoaneurysms yesterday.   Objective   Telemetry: atrial fib,HR mostly 50's with occasional pauses up to 2.6 sec Physical Exam: Filed Vitals:   08/03/12 0453  BP: 119/69  Pulse: 64  Temp: 98.1 F (36.7 C)  Resp: 17   General: Well developed, well nourished WF in no acute distress. Head: Normocephalic, atraumatic, sclera non-icteric, no xanthomas, nares are without discharge. Neck: JVD not elevated. Lungs: Clear bilaterally to auscultation without wheezes, rales, or rhonchi. Breathing is unlabored. Heart: Irregular rhythm, rate controlled, mechanical valve, 2/6 SEM without rubs or gallops.   Abdomen: Soft, non-tender, non-distended with normoactive bowel sounds. No hepatomegaly. No rebound/guarding. No obvious abdominal masses. Msk:  Strength and tone appear normal for age. Extremities: No clubbing or cyanosis. No edema.  Distal pedal pulses are 2+ and equal bilaterally. Large R groin hematoma with extensive ecchymosis with femoral bruit noted Neuro: Alert and oriented to self, but doesn't know exactly where she is or what the date is. Follows commands. Pleasant affect. Moves all extremities spontaneously. Psych:  Responds to questions appropriately with a normal affect.    Intake/Output Summary (Last 24 hours) at 08/03/12 0735 Last data filed at 08/02/12 1714  Gross per 24 hour  Intake    720 ml  Output   1202 ml  Net   -482 ml    Inpatient Medications:  . B-complex with vitamin C  1 tablet Oral Daily  . digoxin  0.125 mg Oral Daily  . donepezil  10 mg Oral QHS  . folic acid-pyridoxine-cyancobalamin  1 tablet Oral Daily  . memantine  10 mg Oral BID  . multivitamin with minerals  1 tablet Oral Daily  . omega-3 acid ethyl esters  1 g Oral Daily  . sodium chloride   3 mL Intravenous Q12H  . sodium chloride  3 mL Intravenous Q12H  . vitamin C  500 mg Oral TID  . warfarin  1 each Does not apply Once  . Warfarin - Pharmacist Dosing Inpatient   Does not apply q1800    Labs: No results found for this basename: NA, K, CL, CO2, GLUCOSE, BUN, CREATININE, CALCIUM, MG, PHOS,  in the last 72 hours No results found for this basename: AST, ALT, ALKPHOS, BILITOT, PROT, ALBUMIN,  in the last 72 hours  Recent Labs  08/02/12 0528 08/03/12 0635  WBC 8.8 7.3  HGB 11.9* 12.0  HCT 35.7* 36.2  MCV 93.0 93.3  PLT 137* 135*    Radiology/Studies:  Dg Chest 2 View 07/30/2012  *RADIOLOGY REPORT*  Clinical Data: History of CHF  CHEST - 2 VIEW  Comparison: None.  Findings: Cardiomegaly.  Increased interstitial markings without frank interstitial edema.  No pleural effusion or pneumothorax.  Degenerative changes of the visualized thoracolumbar spine.  IMPRESSION: Cardiomegaly.  No frank interstitial edema.   Original Report Authenticated By: Charline Bills, M.D.     Assessment and Plan  1. Mechanical aortic valve dysfunction: s/p TEE/cath this admission, seen by TCTS and plan is for conservative care and outpt surveillance echoes given current asymptomatic state. Continue heparin->Coumadin bridge.  2. History of mechanical MVR 3. Atrial fibrillation - rate controlled, occasional pauses and bradycardia. Will discontinue digoxin and follow rate. 4. Dementia - continue home meds. 5. R femoral pseudoaneurysms -  VVS to formally evaluate today, but tentatively for compression tomorrow.  Signed, Tammy Spies PA-C Agree with above.  The size of the pseudoaneurysm is about the same as yesterday. Bruit the same or slightly softer. VVS will consult formally today.

## 2012-08-03 NOTE — Consult Note (Signed)
[facility] [place] HISTORY AND PHYSICAL   History of Present Illness:  Patient is a 67 y.o. year old female who presents for evaluation of femoral pseudoaneurysm.  Pt had diagnostic cath for aortic stenosis 5 days ago.  4 Fr sheath was used.  Noted yesterday to have hematoma in groin.  Ultrasound showed right femoral pseudoaneurysm.  Pt is on coumadin for mechanical aortic and mitral valve.  She is currently on coumadin and heparin.  Other medical problems include dementia, elevated cholesterol, atrial fibrillation all of which are stable.  She has severe asymptomatic aortic stenosis.  Past Medical History  Diagnosis Date  . Atrial fibrillation   . Hypercholesterolemia   . Long term (current) use of anticoagulants     coumadin therapy  . Aortic valve disorder     a. s/p mech avr in 1986 - [facility];  b. 06/2012 Echo: EF 55-60%, Critical AS, mean grad , Peak grad , mvr functioning well.  . Mitral valve disorder     a. s/p mech mvr in 1986 - [facility];  b. 06/2012 Echo: nl fxnng mvr.  . Dementia   . 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 S/P mitral valve replacement with metallic valve 08/06/1994    St Jude mechanical prosthesis, size 29mm - Dr Pricilla Holm at [facility]  . 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 Aortic valve replacement  08/06/1994    21mm St Jude   . Mitral valve replacement  08/06/1994    29mm St Jude  . Abdominal hysterectomy    . Tee without cardioversion N/A 07/29/2012    Procedure: TRANSESOPHAGEAL ECHOCARDIOGRAM (TEE);  Surgeon: Lewayne Bunting, MD;  Location: [facility] ENDOSCOPY;  Service: Cardiovascular;  Laterality: N/A;     Social History History  Substance Use Topics  . Smoking status: Former Games developer  . Smokeless tobacco: Not on file  . Alcohol Use: No     Family History History reviewed. No pertinent family history.  Allergies  No Known Allergies   Current Facility-Administered Medications  Medication Dose Route Frequency Provider Last Rate Last Dose  . 0.9 %  sodium chloride infusion  250 mL Intravenous PRN Wendall Stade, MD      . acetaminophen (TYLENOL) tablet 650 mg  650 mg Oral Q4H PRN Rollene Rotunda, MD      . B-complex with vitamin C tablet 1 tablet  1 tablet Oral Daily Wendall Stade, MD   1 tablet at 08/03/12 1105  . donepezil (ARICEPT) tablet 10 mg  10 mg Oral QHS Ok Anis, NP   10 mg at 08/02/12 2137  . folic acid-pyridoxine-cyancobalamin (FOLTX) 2.5-25-2 MG per tablet 1 tablet  1 tablet Oral Daily Wendall Stade, MD   1 tablet at 08/03/12 1104  . heparin ADULT infusion 100 units/mL (25000 units/250 mL)  1,000 Units/hr Intravenous Continuous Wendall Stade, MD 10 mL/hr at 08/02/12 1714 1,000 Units/hr at 08/02/12 1714  . memantine (NAMENDA) tablet 10 mg  10 mg Oral BID Ok Anis, NP   10 mg at 08/03/12 1104  . multivitamin with minerals tablet 1 tablet  1 tablet Oral Daily Ok Anis, NP   1 tablet at 08/03/12 1105  . omega-3 acid ethyl esters (LOVAZA) capsule 1 g  1 g Oral  Daily Wendall Stade, MD   1 g at 08/03/12 1105  . ondansetron (ZOFRAN) injection 4 mg  4 mg Intravenous Q6H PRN Rollene Rotunda, MD      . sodium chloride 0.9 % injection 3 mL  3 mL Intravenous Q12H Wendall Stade, MD   3 mL at 07/31/12 0945  . sodium chloride 0.9 % injection 3 mL  3 mL Intravenous Q12H Wendall Stade, MD      . sodium chloride 0.9 % injection 3 mL  3 mL Intravenous PRN Wendall Stade, MD      . vitamin C (ASCORBIC ACID) tablet 500 mg  500 mg Oral TID Ok Anis, NP   500 mg at 08/03/12 1104  . warfarin (COUMADIN) tablet 5 mg  5 mg Oral ONCE-1800 Wendall Stade, MD      . warfarin (COUMADIN) video 1 each  1 each Does not apply Once Wendall Stade, MD      . Warfarin - Pharmacist Dosing Inpatient    Does not apply q1800 Fredrik Rigger, RPH        ROS:   General:  No weight loss, Fever, chills  HEENT: No recent headaches, no nasal bleeding, no visual changes, no sore throat  Neurologic: No dizziness, blackouts, seizures. No recent symptoms of stroke or mini- stroke. No recent episodes of slurred speech, or temporary blindness.  Cardiac: No recent episodes of chest pain/pressure, no shortness of breath at rest.  No shortness of breath with exertion.  Denies history of atrial fibrillation or irregular heartbeat  Vascular: No history of rest pain in feet.  No history of claudication.  No history of non-healing ulcer, No history of DVT   Pulmonary: No home oxygen, no productive cough, no hemoptysis,  No asthma or wheezing  Musculoskeletal:  [ ]  Arthritis, [ ]  Low back pain,  [ ]  Joint pain  Hematologic:No history of hypercoagulable state.  No history of easy bleeding.  No history of anemia  Gastrointestinal: No hematochezia or melena,  No gastroesophageal reflux, no trouble swallowing  Urinary: [ ]  chronic Kidney disease, [ ]  on HD - [ ]  MWF or [ ]  TTHS, [ ]  Burning with urination, [ ]  Frequent urination, [ ]  Difficulty urinating;   Skin: No rashes  Psychological: No history of anxiety,  No history of depression   Physical Examination  Filed Vitals:   08/02/12 0503 08/02/12 1351 08/02/12 2202 08/03/12 0453  BP: 129/49 105/57 108/58 119/69  Pulse: 52 57 71 64  Temp: 97.9 F (36.6 C) 98.2 F (36.8 C) 98.1 F (36.7 C) 98.1 F (36.7 C)  TempSrc: Oral Oral Oral Oral  Resp: 20 18 18 17   Height:      Weight:      SpO2: 99% 97% 98% 97%    Body mass index is 24.76 kg/(m^2).  General:  Alert and oriented, no acute distress HEENT: Normal Neck: No JVD Pulmonary: Clear to auscultation bilaterally Abdomen: Soft, non-tender, non-distended, area of ecchymosis right groin with 4 cm mass pulsatile Skin: No rash, no breakdown in groin Extremity Pulses:  2+ radial, brachial,  femoral, dorsalis pedis right leg Musculoskeletal: No deformity or edema  Neurologic: Upper and lower extremity motor 5/5 and symmetric  DATA: duplex reviewed, appears to be 2 separate discrete pseudoaneuryms approx 2 cm   CBC    Component Value Date/Time   WBC 7.3 08/03/2012 0635   RBC 3.88 08/03/2012 0635   HGB 12.0 08/03/2012 0635  HCT 36.2 08/03/2012 0635   PLT 135* 08/03/2012 0635   MCV 93.3 08/03/2012 0635   MCH 30.9 08/03/2012 0635   MCHC 33.1 08/03/2012 0635   RDW 15.2 08/03/2012 0635   MONOABS 0.6 07/09/2008 0902   EOSABS 0.1 07/09/2008 0902   BASOSABS 0.0 07/09/2008 0902   INR today 2.0   ASSESSMENT: Pseudoaneurysm right groin scheduled for compression tomorrow.  Chances of success may be somewhat limited by her anticoagulation and thrombocytopenia.  Preferably best chance would be to have INR normalized and heparin off for a few hours   PLAN:  Would hold coumadin for now.  If unsuccessful compression tomorrow would let INR drift down to normal while on heparin and then schedule a window she can be off heparin to consider repeat compression or thrombin injection.  I will cancel her coumadin today  Fabienne Bruns, MD [facility] [place] Office: [phone] Pager: [phone]

## 2012-08-04 DIAGNOSIS — R1900 Intra-abdominal and pelvic swelling, mass and lump, unspecified site: Secondary | ICD-10-CM

## 2012-08-04 DIAGNOSIS — I729 Aneurysm of unspecified site: Secondary | ICD-10-CM

## 2012-08-04 LAB — HEPARIN LEVEL (UNFRACTIONATED): Heparin Unfractionated: <0.1 IU/mL — ABNORMAL LOW (ref 0.30–0.70)

## 2012-08-04 LAB — PROTIME-INR: Prothrombin Time: 22.4 seconds — ABNORMAL HIGH (ref 11.6–15.2)

## 2012-08-04 LAB — CBC
MCHC: 33.8 g/dL (ref 30.0–36.0)
RDW: 15.2 % (ref 11.5–15.5)

## 2012-08-04 MED ORDER — SODIUM CHLORIDE 0.9 % IV SOLN
INTRAVENOUS | Status: DC
  Administered 2012-08-04 – 2012-08-05 (×2): via INTRAVENOUS

## 2012-08-04 MED ORDER — HEPARIN (PORCINE) IN NACL 100-0.45 UNIT/ML-% IJ SOLN
950.0000 [IU]/h | INTRAMUSCULAR | Status: DC
  Administered 2012-08-04: 950 [IU]/h via INTRAVENOUS
  Filled 2012-08-04 (×2): qty 250

## 2012-08-04 MED ORDER — HYDROCODONE-ACETAMINOPHEN 5-325 MG PO TABS
1.0000 | ORAL_TABLET | ORAL | Status: DC | PRN
  Administered 2012-08-04: 2 via ORAL
  Filled 2012-08-04: qty 2

## 2012-08-04 MED ORDER — MORPHINE SULFATE 2 MG/ML IJ SOLN
2.0000 mg | INTRAMUSCULAR | Status: DC | PRN
  Administered 2012-08-04: 2 mg via INTRAVENOUS
  Filled 2012-08-04: qty 2

## 2012-08-04 NOTE — Progress Notes (Signed)
ANTICOAGULATION CONSULT NOTE - Follow Up Consult  Pharmacy Consult for Heparin Indication: hx of atrial fibrillation, AVR/MVR   No Known Allergies  Patient Measurements: Height: 5\' 5"  (165.1 cm) Weight: 148 lb 13 oz (67.5 kg) IBW/kg (Calculated) : 57 Heparin Dosing Weight: 67.5kg  Vital Signs: Temp: 98.1 F (36.7 C) (03/10 1250) Temp src: Oral (03/10 1250) BP: 126/50 mmHg (03/10 1250) Pulse Rate: 55 (03/10 1250)  Labs:  Recent Labs  08/02/12 0528 08/03/12 0635 08/04/12 0850  HGB 11.9* 12.0 12.5  HCT 35.7* 36.2 37.0  PLT 137* 135* 142*  LABPROT 21.6* 22.2* 22.4*  INR 1.96* 2.04* 2.06*  HEPARINUNFRC 0.54 0.52 <0.10*    Estimated Creatinine Clearance: 60.6 ml/min (by C-G formula based on Cr of 0.5).   Assessment: 67 y/o female with h/o MVR/AVR on anticoagulation, s/p compression of right femoral artery pseudoanurysms this afternoon, confirmed with MD, will start IV heparin 6 hrs after compression. Patient previously therapeutic on 1000 units/hr.   Goal of Therapy:  INR 2-3 Monitor platelets by anticoagulation protocol: Yes   Plan:  - Start heparin infusion with no bolus 950 units/hr at 2000 - f/u heparin level in AM - f/u plan for restarting coumadin  Riki Rusk 08/04/2012,4:26 PM

## 2012-08-04 NOTE — Progress Notes (Signed)
Pseudoaneurysm compression completed. Patient given 2 mg morphine IV for pain during procedure. Vitals remained stable throughout. Frequent vitals in place. Site WNL. Bed rest for 4 hours. Will continue to monitor site. Tammy Walter

## 2012-08-04 NOTE — Progress Notes (Signed)
Subjective:  Denies SSCP, palpitations or Dyspnea Pt denies chest pain or SOB. Leg is not making her uncomfortable.   Objective:  Filed Vitals:   08/03/12 0453 08/03/12 1500 08/03/12 2013 08/04/12 0610  BP: 119/69 115/58 129/54 105/64  Pulse: 64 75 69 57  Temp: 98.1 F (36.7 C) 98.6 F (37 C) 98.5 F (36.9 C) 98 F (36.7 C)  TempSrc: Oral Oral Oral Oral  Resp: 17  18 17   Height:      Weight:      SpO2: 97% 98% 98% 97%    Intake/Output from previous day:  Intake/Output Summary (Last 24 hours) at 08/04/12 8469 Last data filed at 08/03/12 1700  Gross per 24 hour  Intake    360 ml  Output    200 ml  Net    160 ml    Physical Exam: General: Well developed, well nourished, female in no acute distress Head: Eyes PERRLA, No xanthomas.   Normocephalic and atraumatic  Lungs: Clear bilaterally to auscultation. Heart: HRRR S1 S2, without RG. 3/6 murmur. Pulses are 2+ & equal.  No JVD. + femoral bruit right. Abdomen: Bowel sounds are present, abdomen soft and non-tender without masses or  hernias noted. Msk: Normal strength and tone for age. Extremities: No clubbing, cyanosis or edema.    Skin:  No rashes or lesions noted. Neuro: Alert and oriented X 3. Psych:  Good affect, responds appropriately    Lab Results: CBC: Recent Labs  08/02/12 0528 08/03/12 0635  WBC 8.8 7.3  HGB 11.9* 12.0  HCT 35.7* 36.2  MCV 93.0 93.3  PLT 137* 135*   Lab Results  Component Value Date   INR 2.04* 08/03/2012   INR 1.96* 08/02/2012   INR 1.39 08/01/2012   PROTIME 22.8 11/01/2008    ECG:  Orders placed during the hospital encounter of 07/28/12  . EKG 12-LEAD  . EKG 12-LEAD     Telemetry:   Echo: 07/29/2012  Conclusions  - Left ventricle: Systolic function was normal. The estimated ejection fraction was in the range of 55% to 60%. Wall motion was normal; there were no regional wall motion abnormalities. - Mitral valve: A mechanical prosthesis was present. Mild regurgitation. -  Left atrium: The atrium was moderately dilated. - Right atrium: The atrium was mildly dilated. - Atrial septum: No defect or patent foramen ovale was identified. - Tricuspid valve: No evidence of vegetation. Recommendations: Prosthetic MV appears to functioning normally (mean gradient of 6 mmHg; mild MR); prosthetic aortic valve not visualized; turbulence not in aortic root.    Medications:   . B-complex with vitamin C  1 tablet Oral Daily  . donepezil  10 mg Oral QHS  . folic acid-pyridoxine-cyancobalamin  1 tablet Oral Daily  . memantine  10 mg Oral BID  . multivitamin with minerals  1 tablet Oral Daily  . omega-3 acid ethyl esters  1 g Oral Daily  . sodium chloride  3 mL Intravenous Q12H  . sodium chloride  3 mL Intravenous Q12H  . vitamin C  500 mg Oral TID     . sodium chloride    . heparin Stopped (08/04/12 0500)    Assessment/Plan:  AVR: Reviewed TEE and cath films with Dr Hochrein/Cooper/Mclean. Aortic valve appears to not open fully accounting for gradient without significant AR. Dr Cornelius Moras feels patient not a good surgical candidate and would wait for high risk surgery until paitent has CHF   MVR: Functioning well but 67 years old  Anticoagulation: Was on heparin with Coumadin restarted but discontinued due to pseudoaneurysm. Last coumadin dose 3/9. Restart 6 hr after compression. D/C when INR> 2.0, per MD.    Dementia: Continue namenda and aricept patient is compensated and functional  Pseudoaneurysm: 2 separate ones seen on [place]: Preliminary report: Positive for what appears to be two separate pseudoaneurysms rising from the right common femoral artery. Difficult to image may possibly be a multichambered pseudoaneurysm. The larger chamber or pseudoaneurysm measures 2 cm x 1 cm Vascular lab to re-assess today at 11 am and for compression planned if not resolved.   Otherwise, continue home Rx. Principal Problem:   Prosthetic valve dysfunction Active Problems:    HYPERCHOLESTEROLEMIA   Aortic valve disorders   Atrial fibrillation   S/P aortic valve replacement with metallic valve   S/P mitral valve replacement with metallic valve    Rhonda Barrett 08/04/2012, 8:33 AM   With her need for anticoagulation she is not likely to resolve even with thrombin  May need surgical closure Dr Darrick Penna has seen

## 2012-08-04 NOTE — Progress Notes (Signed)
Pseudoaneurysm compression performed by vascular svc.  Pt VS stable, pain control with hydrocodone and small amount morphine. Pt stable throughout procedure and comfortable afterwards. RN to follow. Bedrest x 4 hours and recheck later today planned. Family not available for update.

## 2012-08-04 NOTE — Progress Notes (Signed)
Vascular Lab Preliminary.  Successful compression of right femoral artery pseudoaneurysm after 10 minutes compression.  Gertie Fey, RDCS, RDMS 08/04/2012

## 2012-08-05 LAB — HEPARIN LEVEL (UNFRACTIONATED): Heparin Unfractionated: 0.33 IU/mL (ref 0.30–0.70)

## 2012-08-05 LAB — CBC
Hemoglobin: 11.7 g/dL — ABNORMAL LOW (ref 12.0–15.0)
MCH: 31.4 pg (ref 26.0–34.0)
MCHC: 33.5 g/dL (ref 30.0–36.0)
Platelets: 148 10*3/uL — ABNORMAL LOW (ref 150–400)

## 2012-08-05 MED ORDER — WARFARIN SODIUM 5 MG PO TABS
5.0000 mg | ORAL_TABLET | Freq: Once | ORAL | Status: AC
  Administered 2012-08-05: 5 mg via ORAL
  Filled 2012-08-05: qty 1

## 2012-08-05 MED ORDER — WARFARIN - PHARMACIST DOSING INPATIENT: Freq: Every day | Status: DC

## 2012-08-05 NOTE — Progress Notes (Signed)
Pt's Right groin slightly bleeding. Pressure dressing applied, Pt resting in bed. PA on floor made aware along with Pharmacist. New orders given and enacted. VSS. Will continue to monitor Pt. Pt's husband at bedside.

## 2012-08-05 NOTE — Progress Notes (Signed)
*  Preliminary Results*  Recheck of right femoral artery reveals no evidence of pseudoaneurysm.   08/05/2012 1:51 PM Gertie Fey, RDMS, RDCS

## 2012-08-05 NOTE — Progress Notes (Signed)
ANTICOAGULATION CONSULT NOTE - Follow Up Consult  Pharmacy Consult for Heparin/Coumadin Indication: Hx of Atrial Fibrillation, AVR/MVR   No Known Allergies  Patient Measurements: Height: 5\' 5"  (165.1 cm) Weight: 148 lb 13 oz (67.5 kg) IBW/kg (Calculated) : 57 Heparin Dosing Weight: 67.5kg   Vital Signs: Temp: 98.1 F (36.7 C) (03/11 0524) Temp src: Oral (03/11 0524) BP: 103/77 mmHg (03/11 0524) Pulse Rate: 57 (03/11 0524)  Labs:  Recent Labs  08/03/12 0635 08/04/12 0850 08/05/12 0528  HGB 12.0 12.5 11.7*  HCT 36.2 37.0 34.9*  PLT 135* 142* 148*  LABPROT 22.2* 22.4* 23.7*  INR 2.04* 2.06* 2.23*  HEPARINUNFRC 0.52 <0.10* 0.33    Estimated Creatinine Clearance: 60.6 ml/min (by C-G formula based on Cr of 0.5).   Medications:  . B-complex with vitamin C  1 tablet Oral Daily  . donepezil  10 mg Oral QHS  . folic acid-pyridoxine-cyancobalamin  1 tablet Oral Daily  . memantine  10 mg Oral BID  . multivitamin with minerals  1 tablet Oral Daily  . omega-3 acid ethyl esters  1 g Oral Daily  . sodium chloride  3 mL Intravenous Q12H  . sodium chloride  3 mL Intravenous Q12H  . vitamin C  500 mg Oral TID    Assessment: 68 yo female on Coumadin PTA (5mg  daily except 2.5mg  MF) for atrial fibrillation and AVR/MVR s/p compression of psuedoaneursyms to resume Coumadin. INR 2.23, SCr 0.5 and CrCl 60.32ml/min. Hgb down to 11.7. No evidence of bleeding at this time.   Goal of Therapy:  INR goal 2.5-3.5  Monitor platelets by anticoagulation protocol: Yes   Plan:  Stop heparin infusion per Dr. Eden Emms Coumadin 5mg  x1 tonight  Daily PT/INR  Possible discharge Wed.  Micheline Chapman 08/05/2012,9:30 AM

## 2012-08-05 NOTE — Progress Notes (Signed)
Patient ID: Tammy Walter, female   DOB: [birth year], 67 y.o.   MRN: [id number]    Subjective:  Groin less painful  OOB this am and had BM  Objective:  Filed Vitals:   08/04/12 1245 08/04/12 1250 08/04/12 2104 08/05/12 0524  BP: 117/49 126/50 122/59 103/77  Pulse:  55 54 57  Temp:  98.1 F (36.7 C) 97.4 F (36.3 C) 98.1 F (36.7 C)  TempSrc:  Oral Oral Oral  Resp:    18  Height:      Weight:      SpO2:  98% 99% 98%    Intake/Output from previous day:  Intake/Output Summary (Last 24 hours) at 08/05/12 8469 Last data filed at 08/04/12 1700  Gross per 24 hour  Intake    240 ml  Output    250 ml  Net    -10 ml    Physical Exam: General: Well developed, well nourished, female in no acute distress Head: Eyes PERRLA, No xanthomas.   Normocephalic and atraumatic  Lungs: Clear bilaterally to auscultation. Heart: HRRR S1 S2,click  without RG. 3/6 murmur. Pulses are 2+ & equal.  No JVD. + femoral bruit right. Abdomen: Bowel sounds are present, abdomen soft and non-tender without masses or  hernias noted. Msk: Normal strength and tone for age. Extremities: No clubbing, cyanosis or edema.    Skin:  No rashes or lesions noted. Neuro: Alert and oriented X 3. Psych:  Good affect, responds appropriately Hematoma right groin not pulsatile and no bruit    Lab Results: CBC:  Recent Labs  08/04/12 0850 08/05/12 0528  WBC 7.6 8.7  HGB 12.5 11.7*  HCT 37.0 34.9*  MCV 93.2 93.6  PLT 142* 148*   Lab Results  Component Value Date   INR 2.23* 08/05/2012   INR 2.06* 08/04/2012   INR 2.04* 08/03/2012   PROTIME 22.8 11/01/2008    ECG:  Orders placed during the hospital encounter of 07/28/12  . EKG 12-LEAD  . EKG 12-LEAD     Telemetry:  Afib with no long pauses  ECG:  07/28/12  Afib rate 53 no acute ischemic changes  Echo: 07/29/2012  Conclusions  - Left ventricle: Systolic function was normal. The estimated ejection fraction was in the range of 55% to 60%. Wall motion was normal;  there were no regional wall motion abnormalities. - Mitral valve: A mechanical prosthesis was present. Mild regurgitation. - Left atrium: The atrium was moderately dilated. - Right atrium: The atrium was mildly dilated. - Atrial septum: No defect or patent foramen ovale was identified. - Tricuspid valve: No evidence of vegetation. Recommendations: Prosthetic MV appears to functioning normally (mean gradient of 6 mmHg; mild MR); prosthetic aortic valve not visualized; turbulence not in aortic root.    Medications:   . B-complex with vitamin C  1 tablet Oral Daily  . donepezil  10 mg Oral QHS  . folic acid-pyridoxine-cyancobalamin  1 tablet Oral Daily  . memantine  10 mg Oral BID  . multivitamin with minerals  1 tablet Oral Daily  . omega-3 acid ethyl esters  1 g Oral Daily  . sodium chloride  3 mL Intravenous Q12H  . sodium chloride  3 mL Intravenous Q12H  . vitamin C  500 mg Oral TID     . sodium chloride 30 mL/hr at 08/05/12 0322  . heparin 950 Units/hr (08/04/12 2016)    Assessment/Plan:  AVR: Malfunctioning AVR with failure to full open.  No surgery per Dr Cornelius Moras unless  sings of CHF  EF normal and patient walking 1-2 miles/day before admission.   MVR: Functioning well but 67 years old  Anticoagulation: INR low Rx D/C heparin with compression done yesterday  F/U coumadin clinic Monday  Dementia: Continue namenda and aricept patient is compensated and functional but this is primary reason she  Is not having repeat surgery with pump run  Pseudoaneurysm: Successful compression yesterday no bruit Recheck since it was large and she is anticoagulated  Disopositio:  Discharge in am if groin ok with ambulation today  Otherwise, continue home Rx. Principal Problem:   Prosthetic valve dysfunction Active Problems:   HYPERCHOLESTEROLEMIA   Aortic valve disorders   Atrial fibrillation   S/P aortic valve replacement with metallic valve   S/P mitral valve replacement with  metallic valve    [name] 08/05/2012, 8:07 AM

## 2012-08-06 ENCOUNTER — Encounter (HOSPITAL_COMMUNITY): Payer: Self-pay | Admitting: Physician Assistant

## 2012-08-06 DIAGNOSIS — R001 Bradycardia, unspecified: Secondary | ICD-10-CM

## 2012-08-06 DIAGNOSIS — I729 Aneurysm of unspecified site: Secondary | ICD-10-CM

## 2012-08-06 DIAGNOSIS — Z5189 Encounter for other specified aftercare: Secondary | ICD-10-CM

## 2012-08-06 LAB — PROTIME-INR: INR: 1.93 — ABNORMAL HIGH (ref 0.00–1.49)

## 2012-08-06 MED ORDER — WARFARIN SODIUM 7.5 MG PO TABS
7.5000 mg | ORAL_TABLET | Freq: Once | ORAL | Status: AC
  Administered 2012-08-06: 7.5 mg via ORAL
  Filled 2012-08-06: qty 1

## 2012-08-06 MED ORDER — WARFARIN SODIUM 5 MG PO TABS: ORAL_TABLET | ORAL | Status: AC

## 2012-08-06 NOTE — Discharge Summary (Signed)
Discharge Summary   Patient ID: Tammy Walter,  MRN: [id number], DOB/AGE: Tammy 08, 1947 67 y.o.  Admit date: 07/28/2012 Discharge date: 08/06/2012  Primary Physician: Ralene Ok, MD Primary Cardiologist: P. Eden Emms, MD  Discharge Diagnoses Principal Problem:   Prosthetic valve dysfunction Active Problems:   HYPERCHOLESTEROLEMIA   Aortic valve disorders   Atrial fibrillation   S/P aortic valve replacement with metallic valve   S/P mitral valve replacement with metallic valve   Pseudoaneurysm   Bradycardia   Allergies No Known Allergies  Diagnostic Studies/Procedures  TRANSESOPHAGEAL ECHOCARDIOGRAM - 07/29/12  Study Conclusions  - Left ventricle: Systolic function was normal. The estimated ejection fraction was in the range of 55% to 60%. Wall Motion was normal; there were no regional wall motion abnormalities. - Mitral valve: A mechanical prosthesis was present. Mild regurgitation. - Left atrium: The atrium was moderately dilated. - Right atrium: The atrium was mildly dilated. - Atrial septum: No defect or patent foramen ovale was identified. - Tricuspid valve: No evidence of vegetation. Recommendations: Prosthetic MV appears to functioning normally (mean gradient of 6 mmHg; mild MR); prosthetic aortic valve not visualized; turbulence not in aortic root.  CARDIAC CATHETERIZATION - 07/29/12  Hemodynamics:  RA 10  RV 37/2  PA 33/14 mean 25  PCWP mean 16  LV NA  AO 132/56  Oxygen saturations:  PA 76  AO 99  Cardiac Output (Fick) 5.8 Cardiac Index (Fick) 3.2  Coronary angiography:  Coronary dominance: right  Left mainstem: Normal  Left anterior descending (LAD): Wraps the apex. Normal. D1 moderate sized and normal  Left circumflex (LCx): AV groove vessel large and normal. OM large and normal. PL moderate size and normal  Right coronary artery (RCA): Large and normal. PDA and PL moderate sized an normal.  Left ventriculography: No left ventriculogram. We did not cross  with the mechanical AVR. We did multiple fluoroscopy images of the AVR there is symmetric valve opening. However, the degree of opening of both leaflets seems to be reduced.  Final Conclusions: No obstructive CAD. By fluoroscopy the mechanical AVR (bileaflet tilting disc) seems to have symmetric opening of the leaflets. However, we cannot exclude a symmetric obstruction to AV opening. Normal coronaries. Normal right heart pressures.   PA/LATERAL CHEST X-RAY - 07/30/12  IMPRESSION:  Cardiomegaly. No frank interstitial edema.  LOWER EXTREMITY ARTERIAL DUPLEX - 08/02/12  - Positive for pseudoaneurysm. Image appears to be two separate pseudoaneurysms with one having a neck measuring 2.6 mm and the second 1.8 mm. The larger of the pseudoaneurysms measures 20 mm x 9.2 mm. Unable to measure the second due to location.  - No evidence of arteriovenous fistula.  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 Pre-compression pseudoaneurysm neck measured 0.65cm in width and 2.1cm in length.  LOWER EXTREMITY ARTERIAL DUPLEX - 08/05/12  Summary: Recheck of right femoral artery reveals no evidence of pseudoaneurysm.  History of Present Illness  Tammy Walter is a 67 y.o. female who was admitted to [facility] on 07/28/12 with the above problem list.   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 She recently followed up with Dr. Eden Emms on 1/24 and found to be doing well. A 2D echo was ordered to evaluate her prosthetic valves which revealed critical AS with question of a malfunctioning vs mismatched prosthetic AV. The patient and her husband were contacted  and advised to hold Coumadin with plans to undergo TEE and cardiac catheterization once sub-therapeutic. She was electively admitted on 07/28/12 once INR  was found to be 1.3. She was bridged with heparin. She was continued on home medications.   Hospital Course   She underwent TEE the following day which revealed EF 55-60%, MV prosthesis with mild MR, mild-mod biatrial enlargement. Prosthetic MV was assessed to be functioning normally (mean gradient of 6 mmHg; mild MR); prosthetic AV not visualized; turbulence not in aortic root. Of note, given the patient's dementia, consent was obtained via telephone with her son.   The following day, she was informed, consented and prepped for cardiac catheterization which revealed the above findings. This was notable for normal coronaries, normal right heart pressures. By fluoroscopy, the AV appeared to have symmetric opening of the leaflets, however symmetric obstruction of the AV opening could not be excluded.   Given these findings, TCTS was consulted for evaluation of and candidacy for AVR. MV demonstrated to be functioning well overall. Given her preserved EF, lack of CHF symptoms, activity limitation (walking 1-2 miles/day prior to admission) and dementia, AVR was deferred.   She was found to be bradycardic and digoxin was held with continued rate-control. Heparin and Coumadin were resumed with an aim to achieve a therapeutic INR prior to discharge, however she developed two separate groin pseudoaneurysms post-cath which was successfully compressed via vascular ultrasound guidance (see ultrasounds above), but required the discontinuation of heparin. Her Coumadin was resumed and trended up. She was evaluated by Dr. Elease Hashimoto this morning and deemed stable for discharge. Her INR was 1.93 today. After discussing with Dr. Elease Hashimoto, she will increase Coumadin for the next two days to 7.5mg , then resume her usual outpatient regimen. Coumadin follow-up has been arranged for early next week. She will follow-up with Dr. Eden Emms in 3 months. Low-dose ASA will be continued given increased risk of thromboembolism with mechanical VR  x 2.   Discharge Vitals:  Blood pressure 135/63, pulse 64, temperature 97.6 F (36.4 C), temperature source Oral, resp. rate 17, height 5\' 5"  (1.651 m), weight 67.5 kg (148 lb 13 oz), SpO2 98.00%.   Labs: Recent Labs     08/04/12  0850  08/05/12  0528  WBC  7.6  8.7  HGB  12.5  11.7*  HCT  37.0  34.9*  MCV  93.2  93.6  PLT  142*  148*   Disposition:  Discharge Orders   Future Appointments Armentha Branagan Department Dept Phone   08/11/2012 9:00 AM [facility] [phone]   11/11/2012 9:00 AM Wendall Stade, MD [facility] Main Office [place]) [phone]   Future Orders Complete By Expires     Diet - low sodium heart healthy  As directed     Increase activity slowly  As directed           Follow-up Information   Follow up with [facility] On 08/11/2012. (At 9:00 AM for Coumadin check. )    Contact information:   [address] [place] [address]       Follow up with [name], MD On 11/11/2012. (At 9:00 AM for follow-up. )    Contact information:   [address] [place] [address] [phone]       Discharge Medications:    Medication List    STOP taking these medications       digoxin 0.25 MG tablet  Commonly known as:  LANOXIN      TAKE these medications       aspirin 81 MG EC tablet  Take 81 mg by mouth daily.     AXONA PO  Take 1 packet by mouth daily.     b complex vitamins tablet  Take 1 tablet by mouth daily.     CALCIUM 500 PO  Take 1 tablet by mouth daily.     donepezil 10 MG tablet  Commonly known as:  ARICEPT  Take 10 mg by mouth at bedtime.     fish oil-omega-3 fatty acids 1000 MG capsule  Take 1 g by mouth daily.     l-methylfolate-b2-b6-b12 10-26-48-5 MG Tabs  Commonly known as:  CEREFOLIN  Take 1 tablet by mouth daily.     memantine 10 MG tablet  Commonly known as:  NAMENDA  Take 10 mg by mouth 2 (two) times  daily.     multivitamin with minerals Tabs  Take 1 tablet by mouth daily.     vitamin C 500 MG tablet  Commonly known as:  ASCORBIC ACID  Take 500 mg by mouth 3 (three) times daily.     vitamin E 400 UNIT capsule  Take 400 Units by mouth daily.     warfarin 5 MG tablet  Commonly known as:  COUMADIN  Take 7.5mg  08/06/12 and 08/07/12, then continue 2.5mg  on M, F, 5mg  on S, T, W, Th, Sat       Outstanding Labs/Studies: PT/INR on 08/11/12  Duration of Discharge Encounter: Greater than 30 minutes including physician time.  Signed, R. Hurman Horn, PA-C 08/06/2012, 2:13 PM    Attending Note:   The patient was seen and examined.  Agree with assessment and plan as noted above.  Changes made to the above note as needed.  See my note from earlier today   Alvia Grove., MD, [facility] 08/06/2012, 4:08 PM

## 2012-08-06 NOTE — Progress Notes (Signed)
Pt and husband educated and informed of DC information. Pt aware of s/s of infection and when to call MD for groin concerns. No further questions or concerns, pt ready for DC with husband home.

## 2012-08-06 NOTE — Progress Notes (Signed)
ANTICOAGULATION CONSULT NOTE - Follow Up Consult  Pharmacy Consult for Heparin/Coumadin Indication: Hx of Atrial Fibrillation, AVR/MVR (mechanical)  No Known Allergies  Patient Measurements: Height: 5\' 5"  (165.1 cm) Weight: 148 lb 13 oz (67.5 kg) IBW/kg (Calculated) : 57 Heparin Dosing Weight: 67.5kg   Vital Signs: Temp: 97.6 F (36.4 C) (03/12 0414) Temp src: Oral (03/12 0414) BP: 135/63 mmHg (03/12 0414) Pulse Rate: 64 (03/12 0414)  Labs:  Recent Labs  08/04/12 0850 08/05/12 0528 08/06/12 0540  HGB 12.5 11.7*  --   HCT 37.0 34.9*  --   PLT 142* 148*  --   LABPROT 22.4* 23.7* 21.3*  INR 2.06* 2.23* 1.93*  HEPARINUNFRC <0.10* 0.33  --     Estimated Creatinine Clearance: 60.6 ml/min (by C-G formula based on Cr of 0.5).   Medications:  . B-complex with vitamin C  1 tablet Oral Daily  . donepezil  10 mg Oral QHS  . folic acid-pyridoxine-cyancobalamin  1 tablet Oral Daily  . memantine  10 mg Oral BID  . multivitamin with minerals  1 tablet Oral Daily  . omega-3 acid ethyl esters  1 g Oral Daily  . sodium chloride  3 mL Intravenous Q12H  . sodium chloride  3 mL Intravenous Q12H  . vitamin C  500 mg Oral TID  . [COMPLETED] warfarin  5 mg Oral ONCE-1800  . Warfarin - Pharmacist Dosing Inpatient   Does not apply q1800    Assessment: Admit Complaint:67 y/o female with h/o MVR/AVR who was recently found to have malfunctioning AVR (prosthetic) and presents for admission/heparin bridging/tee/cath. Not a good surgical candidate.  Anticoagulation: S/p compression of pseudoaneurysm 3/10. Resumed Coumadin (3/11) (home dose 5mg  daily except 2.5mg  on MF). INR 1.93 slightly low  Infectious Disease: WBC WNL and afeb. No abx.   Cardiovascular: Hx atrial fibrillation and mechanical AVR and mechanical MVR (67 yrs old) > Medical tx only. Dig (no dig level), [place].Not good surgical candidate for malfunctioning AVR.  Endocrinology: TSH wnl, HbA1c 5.4   Gastrointestinal /  Nutrition: cardiac diet, MV, Bcomplex  Neurology: Hx dementia. On Namenda & Aricept, Foltx  Nephrology: Scr 0.50 and CrCl 60.46ml/min.   Pulmonary: 98% on RA  Heme/Onc: H/H 11.7/34.9 (stable) and plts 148 (low stable).   PTA Medication Issues  Best Practices: coumadin   Plan Coumadin 7.5 mg x1 today Coumadin clinic Monday. Discharge home today.   Merilynn Finland, [employer] 08/06/2012,10:27 AM

## 2012-08-06 NOTE — Progress Notes (Signed)
Patient ID: Tammy Walter, female   DOB: Oct 14, 1945, 67 y.o.   MRN: [id number]    Subjective:  Groin less painful  OOB this am and had BM. Ready to go home  Objective:  Filed Vitals:   08/05/12 0524 08/05/12 1403 08/05/12 1944 08/06/12 0414  BP: 103/77 115/69 139/87 135/63  Pulse: 57 69 97 64  Temp: 98.1 F (36.7 C) 98.6 F (37 C) 98.8 F (37.1 C) 97.6 F (36.4 C)  TempSrc: Oral Oral Oral Oral  Resp: 18 18 18 17   Height:      Weight:      SpO2: 98% 99% 98% 98%    Intake/Output from previous day:  Intake/Output Summary (Last 24 hours) at 08/06/12 0856 Last data filed at 08/06/12 0800  Gross per 24 hour  Intake    600 ml  Output    200 ml  Net    400 ml    Physical Exam: General: Well developed, well nourished, female in no acute distress Head: Eyes PERRLA, No xanthomas.   Normocephalic and atraumatic  Lungs: Clear bilaterally to auscultation. Heart: HRRR S1 S2,click  without RG. 3/6 murmur. Pulses are 2+ & equal.  No JVD. + femoral bruit right. Abdomen: Bowel sounds are present, abdomen soft and non-tender without masses or  hernias noted. Msk: Normal strength and tone for age. Extremities: No clubbing, cyanosis or edema.   Right groin hematoma - echymosis,  Skin:  No rashes or lesions noted. Neuro: Alert and oriented X 3. Psych:  Good affect, responds appropriately     Lab Results: CBC:  Recent Labs  08/04/12 0850 08/05/12 0528  WBC 7.6 8.7  HGB 12.5 11.7*  HCT 37.0 34.9*  MCV 93.2 93.6  PLT 142* 148*   Lab Results  Component Value Date   INR 1.93* 08/06/2012   INR 2.23* 08/05/2012   INR 2.06* 08/04/2012   PROTIME 22.8 11/01/2008    ECG:  Orders placed during the hospital encounter of 07/28/12  . EKG 12-LEAD  . EKG 12-LEAD     Telemetry:  Afib with no long pauses  ECG:  07/28/12  Afib rate 53 no acute ischemic changes  Echo: 07/29/2012  Conclusions  - Left ventricle: Systolic function was normal. The estimated ejection fraction was in the range of  55% to 60%. Wall motion was normal; there were no regional wall motion abnormalities. - Mitral valve: A mechanical prosthesis was present. Mild regurgitation. - Left atrium: The atrium was moderately dilated. - Right atrium: The atrium was mildly dilated. - Atrial septum: No defect or patent foramen ovale was identified. - Tricuspid valve: No evidence of vegetation. Recommendations: Prosthetic MV appears to functioning normally (mean gradient of 6 mmHg; mild MR); prosthetic aortic valve not visualized; turbulence not in aortic root.    Medications:   . B-complex with vitamin C  1 tablet Oral Daily  . donepezil  10 mg Oral QHS  . folic acid-pyridoxine-cyancobalamin  1 tablet Oral Daily  . memantine  10 mg Oral BID  . multivitamin with minerals  1 tablet Oral Daily  . omega-3 acid ethyl esters  1 g Oral Daily  . sodium chloride  3 mL Intravenous Q12H  . sodium chloride  3 mL Intravenous Q12H  . vitamin C  500 mg Oral TID  . Warfarin - Pharmacist Dosing Inpatient   Does not apply q1800     . sodium chloride 30 mL/hr at 08/05/12 0102    Assessment/Plan:  AVR: Malfunctioning AVR with  failure to full open.  No surgery per Dr Cornelius Moras unless sings of CHF  EF normal and patient walking 1-2 miles/day before admission.   MVR: Functioning well but 66 years old  Anticoagulation: INR low Rx D/C heparin with compression done yesterday  F/U coumadin clinic Monday  Dementia: Continue namenda and aricept patient is compensated and functional but this is primary reason she is not having repeat surgery   Pseudoaneurysm: Successful compression yesterday no bruit no evidence of pseudoaneurysm on recheck by vascular tech yesterday  Disoposition:  Discharge to home today  Otherwise, continue home Rx. Principal Problem:   Prosthetic valve dysfunction Active Problems:   HYPERCHOLESTEROLEMIA   Aortic valve disorders   Atrial fibrillation   S/P aortic valve replacement with metallic valve    S/P mitral valve replacement with metallic valve    Elyn Aquas. 08/06/2012, 8:56 AM

## 2012-08-11 ENCOUNTER — Ambulatory Visit (INDEPENDENT_AMBULATORY_CARE_PROVIDER_SITE_OTHER): Payer: Medicare Other | Admitting: *Deleted

## 2012-08-11 DIAGNOSIS — I359 Nonrheumatic aortic valve disorder, unspecified: Secondary | ICD-10-CM

## 2012-08-11 DIAGNOSIS — Z954 Presence of other heart-valve replacement: Secondary | ICD-10-CM

## 2012-08-11 DIAGNOSIS — I4891 Unspecified atrial fibrillation: Secondary | ICD-10-CM

## 2012-08-11 DIAGNOSIS — Z7901 Long term (current) use of anticoagulants: Secondary | ICD-10-CM

## 2012-08-11 LAB — POCT INR: INR: 2.1

## 2012-08-18 ENCOUNTER — Ambulatory Visit (INDEPENDENT_AMBULATORY_CARE_PROVIDER_SITE_OTHER): Payer: Medicare Other | Admitting: *Deleted

## 2012-08-18 DIAGNOSIS — I359 Nonrheumatic aortic valve disorder, unspecified: Secondary | ICD-10-CM

## 2012-08-18 DIAGNOSIS — Z954 Presence of other heart-valve replacement: Secondary | ICD-10-CM

## 2012-08-18 DIAGNOSIS — Z7901 Long term (current) use of anticoagulants: Secondary | ICD-10-CM

## 2012-08-18 DIAGNOSIS — I4891 Unspecified atrial fibrillation: Secondary | ICD-10-CM

## 2012-08-18 LAB — POCT INR: INR: 3.2

## 2012-09-08 ENCOUNTER — Ambulatory Visit (INDEPENDENT_AMBULATORY_CARE_PROVIDER_SITE_OTHER): Payer: Medicare Other

## 2012-09-08 DIAGNOSIS — I4891 Unspecified atrial fibrillation: Secondary | ICD-10-CM

## 2012-09-08 DIAGNOSIS — I359 Nonrheumatic aortic valve disorder, unspecified: Secondary | ICD-10-CM

## 2012-09-08 DIAGNOSIS — Z954 Presence of other heart-valve replacement: Secondary | ICD-10-CM

## 2012-09-08 DIAGNOSIS — Z7901 Long term (current) use of anticoagulants: Secondary | ICD-10-CM

## 2012-09-08 LAB — POCT INR: INR: 2.3

## 2012-09-29 ENCOUNTER — Ambulatory Visit (INDEPENDENT_AMBULATORY_CARE_PROVIDER_SITE_OTHER): Payer: Medicare Other

## 2012-09-29 DIAGNOSIS — I4891 Unspecified atrial fibrillation: Secondary | ICD-10-CM

## 2012-09-29 DIAGNOSIS — Z7901 Long term (current) use of anticoagulants: Secondary | ICD-10-CM

## 2012-09-29 DIAGNOSIS — I359 Nonrheumatic aortic valve disorder, unspecified: Secondary | ICD-10-CM

## 2012-09-29 DIAGNOSIS — Z954 Presence of other heart-valve replacement: Secondary | ICD-10-CM

## 2012-09-29 LAB — POCT INR: INR: 3

## 2012-10-27 ENCOUNTER — Ambulatory Visit (INDEPENDENT_AMBULATORY_CARE_PROVIDER_SITE_OTHER): Payer: Medicare Other | Admitting: *Deleted

## 2012-10-27 DIAGNOSIS — Z954 Presence of other heart-valve replacement: Secondary | ICD-10-CM

## 2012-10-27 DIAGNOSIS — Z7901 Long term (current) use of anticoagulants: Secondary | ICD-10-CM

## 2012-10-27 DIAGNOSIS — I359 Nonrheumatic aortic valve disorder, unspecified: Secondary | ICD-10-CM

## 2012-10-27 DIAGNOSIS — I4891 Unspecified atrial fibrillation: Secondary | ICD-10-CM

## 2012-11-11 ENCOUNTER — Ambulatory Visit: Payer: Medicare Other | Admitting: Cardiovascular Disease

## 2012-11-24 ENCOUNTER — Ambulatory Visit (INDEPENDENT_AMBULATORY_CARE_PROVIDER_SITE_OTHER): Payer: Medicare Other | Admitting: *Deleted

## 2012-11-24 DIAGNOSIS — I4891 Unspecified atrial fibrillation: Secondary | ICD-10-CM

## 2012-11-24 DIAGNOSIS — I359 Nonrheumatic aortic valve disorder, unspecified: Secondary | ICD-10-CM

## 2012-11-24 DIAGNOSIS — Z954 Presence of other heart-valve replacement: Secondary | ICD-10-CM

## 2012-11-24 DIAGNOSIS — Z7901 Long term (current) use of anticoagulants: Secondary | ICD-10-CM

## 2013-01-05 ENCOUNTER — Ambulatory Visit (INDEPENDENT_AMBULATORY_CARE_PROVIDER_SITE_OTHER): Payer: Medicare Other | Admitting: *Deleted

## 2013-01-05 DIAGNOSIS — I359 Nonrheumatic aortic valve disorder, unspecified: Secondary | ICD-10-CM

## 2013-01-05 DIAGNOSIS — Z954 Presence of other heart-valve replacement: Secondary | ICD-10-CM

## 2013-01-05 DIAGNOSIS — I4891 Unspecified atrial fibrillation: Secondary | ICD-10-CM

## 2013-01-05 DIAGNOSIS — Z7901 Long term (current) use of anticoagulants: Secondary | ICD-10-CM

## 2013-01-05 LAB — POCT INR: INR: 3.6

## 2013-01-28 ENCOUNTER — Ambulatory Visit: Payer: Medicare Other | Admitting: Cardiovascular Disease

## 2013-02-11 ENCOUNTER — Ambulatory Visit (INDEPENDENT_AMBULATORY_CARE_PROVIDER_SITE_OTHER): Payer: Medicare Other | Admitting: Physician Assistant

## 2013-02-11 ENCOUNTER — Encounter: Payer: Self-pay | Admitting: Physician Assistant

## 2013-02-11 ENCOUNTER — Ambulatory Visit (INDEPENDENT_AMBULATORY_CARE_PROVIDER_SITE_OTHER): Payer: Medicare Other | Admitting: Pharmacist

## 2013-02-11 VITALS — BP 110/80 | HR 72 | Ht 67.0 in | Wt 150.1 lb

## 2013-02-11 DIAGNOSIS — I359 Nonrheumatic aortic valve disorder, unspecified: Secondary | ICD-10-CM

## 2013-02-11 DIAGNOSIS — I498 Other specified cardiac arrhythmias: Secondary | ICD-10-CM

## 2013-02-11 DIAGNOSIS — I350 Nonrheumatic aortic (valve) stenosis: Secondary | ICD-10-CM

## 2013-02-11 DIAGNOSIS — Z7901 Long term (current) use of anticoagulants: Secondary | ICD-10-CM

## 2013-02-11 DIAGNOSIS — I4891 Unspecified atrial fibrillation: Secondary | ICD-10-CM

## 2013-02-11 DIAGNOSIS — R001 Bradycardia, unspecified: Secondary | ICD-10-CM

## 2013-02-11 DIAGNOSIS — Z954 Presence of other heart-valve replacement: Secondary | ICD-10-CM

## 2013-02-11 NOTE — Progress Notes (Signed)
HPI:  This is a 67 year old female patient of Dr. 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 Transthoracic echo does show the presence of significant aortic stenosis without AI, normal function of the mitral prosthesis. LV systolic function remains normal. The patient also suffers from significant dementia. She has been seen by Dr. Cornelius Moras who feels redo aortic valve replacement with root comment considerable risk for the possibility of further accelerating her cognitive decline. He recommends following her with periodic transthoracic echoes. If she would develop symptoms of congestive heart failure a decision would need to be made whether or not to consider her for redo aortic valve replacement. She also has chronic atrial fibrillation and is on Coumadin therapy.  Cardiac catheterization performed in March 2014 showed normal coronary arteries, normal right heart pressures.  TEE showed EF of 55-60% mitral valve prosthesis with mild MR prosthetic aortic valve was not visualized turbulence not in the aortic root. Fluoroscopy the aortic valve appeared to have symmetric opening the leaflets however symmetric obstruction of the aortic valve opening cannot be excluded.  Patient comes in today feeling quite well. She denies any chest pain, palpitations, dyspnea, dyspnea on exertion, orthopnea, leg edema, dizziness, or presyncope. She walks a mile daily with her husband and he says she goes up and down the stairs multiple times each day. She continues to struggle with dementia but is doing well from a cardiac standpoint.   No Known Allergies  Current Outpatient Prescriptions on File Prior to Visit: aspirin 81 MG EC tablet, Take 81 mg by mouth daily.  , Disp: , Rfl:  b complex vitamins tablet, Take 1 tablet by mouth daily.  , Disp: , Rfl:  Calcium Carbonate (CALCIUM 500 PO), Take 1 tablet by  mouth daily.  , Disp: , Rfl:  Dietary Management Product (AXONA PO), Take 1 packet by mouth daily., Disp: , Rfl:  donepezil (ARICEPT) 10 MG tablet, Take 10 mg by mouth at bedtime., Disp: , Rfl:  fish oil-omega-3 fatty acids 1000 MG capsule, Take 1 g by mouth daily., Disp: , Rfl:  l-methylfolate-b2-b6-b12 (CEREFOLIN) 10-26-48-5 MG TABS, Take 1 tablet by mouth daily., Disp: , Rfl:  Memantine HCl ER (NAMENDA XR) 21 MG CP24, Take 1 tablet by mouth daily., Disp: , Rfl:  Multiple Vitamin (MULTIVITAMIN WITH MINERALS) TABS, Take 1 tablet by mouth daily., Disp: , Rfl:  vitamin C (ASCORBIC ACID) 500 MG tablet, Take 500 mg by mouth 3 (three) times daily., Disp: , Rfl:  vitamin E 400 UNIT capsule, Take 400 Units by mouth daily., Disp: , Rfl:  warfarin (COUMADIN) 5 MG tablet, Take 7.5mg  08/06/12 and 08/07/12, then continue 2.5mg  on M, F, 5mg  on S, T, W, Th, Sat, Disp: 30 tablet, Rfl: 3  No current facility-administered medications on file prior to visit.   Past Medical History:   Permanent atrial fibrillation                                Hypercholesterolemia                                         Long term (current) use of anticoagulants  Comment:coumadin therapy   Aortic valve disorder                                          Comment:a. s/p mech avr in 1986 - [facility];  b. 06/2012 Echo:               EF 55-60%, Critical AS, mean grad , Peak               grad , mvr functioning well.   Mitral valve disorder                                          Comment:a. s/p mech mvr in 1986 - [facility];  b. 06/2012 Echo:               nl fxnng mvr.   Dementia                                                     S/P aortic valve replacement with metallic val* 08/06/1994      Comment:St Jude mechanical prosthesis, size 21mm - Dr               Pricilla Holm at [facility]   S/P mitral valve replacement with metallic val* 08/06/1994      Comment:St Jude mechanical prosthesis, size 29mm - Dr                [facility]illa Holm at [facility]   Prosthetic valve dysfunction                    07/30/2012       Comment:a. Severe AS on 07/03/12 TTE b. TEE 07/29/12- EF               55-60%, MV prosthesis with mild MR, mild-mod               biatrial enlargement. Prosthetic MV was               assessed to be functioning normally (mean               gradient of 6 mmHg; mild MR); prosthetic AV not              visualized; turbulence not in aortic root. c.               cardiac cath 07/30/12- AV symmetric opening of               the leaflets, however symmetric obstruction of               the AV opening could not be excluded  Past Surgical History:   AORTIC VALVE REPLACEMENT                         08/06/1994      Comment:35mm St Jude    MITRAL VALVE REPLACEMENT                         08/06/1994      Comment:47mm St  Jude   ABDOMINAL HYSTERECTOMY                                        TEE WITHOUT CARDIOVERSION                       N/A 07/29/2012       Comment:Procedure: TRANSESOPHAGEAL ECHOCARDIOGRAM               (TEE);  Surgeon: Lewayne Bunting, MD;                Location: [facility] ENDOSCOPY;  Service:               Cardiovascular;  Laterality: N/A;   CARDIAC CATHETERIZATION                          07/30/12         Comment:Normal cors, normal right heart pressures. By               fluoroscopy, the AV appeared to have symmetric               opening of the leaflets, however symmetric               obstruction of the AV opening could not be               excluded   No family history on file.   Social History   Marital Status: Married             Spouse Name:                      Years of Education:                 Number of children:             Occupational History   None on file  Social History Main Topics   Smoking Status: Former Smoker                   Packs/Day: 0.00  Years:         Smokeless Status: Not on file                      Alcohol Use: No             Drug Use: No              Sexual Activity: Not on file        Other Topics            Concern   None on file  Social History Narrative   Pt walks her [name], [place] on a regular basis    ROS: See history of present illness otherwise negative   PHYSICAL EXAM: Well-nournished, in no acute distress. Neck: Murmur portrayed in her carotids, No JVD, HJR, Bruit, or thyroid enlargement  Lungs: No tachypnea, clear without wheezing, rales, or rhonchi  Cardiovascular: RRR, PMI not displaced, crisp valvular clicks, lateral 2-3/6 harsh systolic murmur at the left sternal border into her carotids, no gallops, bruit, thrill, or heave.  Abdomen: BS normal. Soft without organomegaly, masses, lesions or tenderness.  Extremities: without cyanosis, clubbing or edema. Good distal pulses bilateral  SKin: Warm, no lesions or rashes  Musculoskeletal: No deformities  Neuro: no focal signs  BP 110/80  Pulse 72  Ht 5\' 7"  (1.702 m)  Wt 150 lb 1.9 oz (68.094 kg)  BMI 23.51 kg/m2    EKG: Atrial fibrillation at 71 beats per minute   07/30/2012 TRANSESOPHAGEAL ECHOCARDIOGRAM - 07/29/12  Study Conclusions  - Left ventricle: Systolic function was normal. The estimated ejection fraction was in the range of 55% to 60%. Wall Motion was normal; there were no regional wall motion abnormalities. - Mitral valve: A mechanical prosthesis was present. Mild regurgitation. - Left atrium: The atrium was moderately dilated. - Right atrium: The atrium was mildly dilated. - Atrial septum: No defect or patent foramen ovale was identified. - Tricuspid valve: No evidence of vegetation. Recommendations: Prosthetic MV appears to functioning normally (mean gradient of 6 mmHg; mild MR); prosthetic aortic valve not visualized; turbulence not in aortic root.  CARDIAC CATHETERIZATION - 07/29/12  Hemodynamics:   RA 10  RV 37/2  PA 33/14 mean 25   PCWP mean 16  LV NA  AO 132/56  Oxygen saturations:   PA 76  AO 99  Cardiac Output  (Fick) 5.8 Cardiac Index (Fick) 3.2  Coronary angiography:   Coronary dominance: right   Left mainstem: Normal   Left anterior descending (LAD): Wraps the apex. Normal. D1 moderate sized and normal  Left circumflex (LCx): AV groove vessel large and normal. OM large and normal. PL moderate size and normal   Right coronary artery (RCA): Large and normal. PDA and PL moderate sized an normal.  Left ventriculography: No left ventriculogram. We did not cross with the mechanical AVR. We did multiple fluoroscopy images of the AVR there is symmetric valve opening. However, the degree of opening of both leaflets seems to be reduced.   Final Conclusions: No obstructive CAD. By fluoroscopy the mechanical AVR (bileaflet tilting disc) seems to have symmetric opening of the leaflets. However, we cannot exclude a symmetric obstruction to AV opening. Normal coronaries. Normal right heart pressures.   07/30/2012 LOWER EXTREMITY ARTERIAL DUPLEX - 08/02/12  - Positive for pseudoaneurysm. Image appears to be two separate pseudoaneurysms with one having a neck measuring 2.6 mm and the second 1.8 mm. The larger of the pseudoaneurysms measures 20 mm x 9.2 mm. Unable to measure the second due to location.   - No evidence of arteriovenous fistula.  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 Pre-compression pseudoaneurysm neck measured 0.65cm in width and 2.1cm in length.  LOWER EXTREMITY ARTERIAL DUPLEX - 08/05/12  Summary: Recheck of right femoral artery reveals no evidence of pseudoaneurysm.

## 2013-02-11 NOTE — Patient Instructions (Addendum)
Your physician recommends that you continue on your current medications as directed. Please refer to the Current Medication list given to you today.  Your physician wants you to follow-up in: 6 months with Dr. Eden Emms. You will receive a reminder letter in the mail two months in advance. If you don't receive a letter, please call our office to schedule the follow-up appointment.  Your physician has requested that you have an echocardiogram. Echocardiography is a painless test that uses sound waves to create images of your heart. It provides your doctor with information about the size and shape of your heart and how well your heart's chambers and valves are working. This procedure takes approximately one hour. There are no restrictions for this procedure. In 6 months

## 2013-02-11 NOTE — Assessment & Plan Note (Signed)
Patient has chronic atrial fibrillation and is on Coumadin therapy. She has had bradycardia on beta blockers in the past and is on no rate lowering medications at this time. There is no evidence of rapid heart rates.

## 2013-02-11 NOTE — Assessment & Plan Note (Signed)
Patient continues to be asymptomatic from her aortic stenosis. She walks daily and has no evidence of heart failure. Continue to monitor. Followup 2-D echo in March as well as a point with Dr.Nishan.

## 2013-03-16 ENCOUNTER — Ambulatory Visit (INDEPENDENT_AMBULATORY_CARE_PROVIDER_SITE_OTHER): Payer: Medicare Other | Admitting: *Deleted

## 2013-03-16 DIAGNOSIS — I4891 Unspecified atrial fibrillation: Secondary | ICD-10-CM

## 2013-03-16 DIAGNOSIS — Z7901 Long term (current) use of anticoagulants: Secondary | ICD-10-CM

## 2013-03-16 DIAGNOSIS — I359 Nonrheumatic aortic valve disorder, unspecified: Secondary | ICD-10-CM

## 2013-03-16 DIAGNOSIS — Z954 Presence of other heart-valve replacement: Secondary | ICD-10-CM

## 2013-04-13 ENCOUNTER — Ambulatory Visit (INDEPENDENT_AMBULATORY_CARE_PROVIDER_SITE_OTHER): Payer: Medicare Other | Admitting: Pharmacist

## 2013-04-13 DIAGNOSIS — Z954 Presence of other heart-valve replacement: Secondary | ICD-10-CM

## 2013-04-13 DIAGNOSIS — I4891 Unspecified atrial fibrillation: Secondary | ICD-10-CM

## 2013-04-13 DIAGNOSIS — I359 Nonrheumatic aortic valve disorder, unspecified: Secondary | ICD-10-CM

## 2013-04-13 DIAGNOSIS — Z7901 Long term (current) use of anticoagulants: Secondary | ICD-10-CM

## 2013-05-18 ENCOUNTER — Ambulatory Visit (INDEPENDENT_AMBULATORY_CARE_PROVIDER_SITE_OTHER): Payer: Medicare Other

## 2013-05-18 DIAGNOSIS — I359 Nonrheumatic aortic valve disorder, unspecified: Secondary | ICD-10-CM

## 2013-05-18 DIAGNOSIS — I4891 Unspecified atrial fibrillation: Secondary | ICD-10-CM

## 2013-05-18 DIAGNOSIS — Z954 Presence of other heart-valve replacement: Secondary | ICD-10-CM

## 2013-05-18 DIAGNOSIS — Z7901 Long term (current) use of anticoagulants: Secondary | ICD-10-CM

## 2013-05-18 LAB — POCT INR: INR: 1.7

## 2013-06-08 ENCOUNTER — Ambulatory Visit (INDEPENDENT_AMBULATORY_CARE_PROVIDER_SITE_OTHER): Payer: Medicare Other | Admitting: *Deleted

## 2013-06-08 DIAGNOSIS — I359 Nonrheumatic aortic valve disorder, unspecified: Secondary | ICD-10-CM

## 2013-06-08 DIAGNOSIS — Z954 Presence of other heart-valve replacement: Secondary | ICD-10-CM

## 2013-06-08 DIAGNOSIS — Z7901 Long term (current) use of anticoagulants: Secondary | ICD-10-CM

## 2013-06-08 DIAGNOSIS — I4891 Unspecified atrial fibrillation: Secondary | ICD-10-CM

## 2013-06-08 LAB — POCT INR: INR: 2.6

## 2013-07-06 ENCOUNTER — Ambulatory Visit (INDEPENDENT_AMBULATORY_CARE_PROVIDER_SITE_OTHER): Payer: Medicare Other | Admitting: *Deleted

## 2013-07-06 DIAGNOSIS — I359 Nonrheumatic aortic valve disorder, unspecified: Secondary | ICD-10-CM

## 2013-07-06 DIAGNOSIS — Z7901 Long term (current) use of anticoagulants: Secondary | ICD-10-CM

## 2013-07-06 DIAGNOSIS — Z5181 Encounter for therapeutic drug level monitoring: Secondary | ICD-10-CM | POA: Insufficient documentation

## 2013-07-06 DIAGNOSIS — I4891 Unspecified atrial fibrillation: Secondary | ICD-10-CM

## 2013-07-06 DIAGNOSIS — Z954 Presence of other heart-valve replacement: Secondary | ICD-10-CM

## 2013-07-06 LAB — POCT INR: INR: 2.7

## 2013-07-30 ENCOUNTER — Ambulatory Visit (HOSPITAL_COMMUNITY): Payer: Medicare Other | Attending: Cardiovascular Disease | Admitting: Radiology

## 2013-07-30 ENCOUNTER — Encounter: Payer: Self-pay | Admitting: Cardiovascular Disease

## 2013-07-30 ENCOUNTER — Other Ambulatory Visit (HOSPITAL_COMMUNITY): Payer: Self-pay | Admitting: Radiology

## 2013-07-30 DIAGNOSIS — I359 Nonrheumatic aortic valve disorder, unspecified: Secondary | ICD-10-CM

## 2013-07-30 DIAGNOSIS — I4891 Unspecified atrial fibrillation: Secondary | ICD-10-CM

## 2013-07-30 NOTE — Progress Notes (Signed)
Echocardiogram Performed. 

## 2013-08-03 ENCOUNTER — Other Ambulatory Visit: Payer: Self-pay | Admitting: *Deleted

## 2013-08-03 DIAGNOSIS — Z952 Presence of prosthetic heart valve: Secondary | ICD-10-CM

## 2013-08-04 ENCOUNTER — Encounter: Payer: Self-pay | Admitting: Cardiovascular Disease

## 2013-08-04 ENCOUNTER — Ambulatory Visit (INDEPENDENT_AMBULATORY_CARE_PROVIDER_SITE_OTHER): Payer: Medicare Other | Admitting: Cardiovascular Disease

## 2013-08-04 ENCOUNTER — Ambulatory Visit (INDEPENDENT_AMBULATORY_CARE_PROVIDER_SITE_OTHER): Payer: Medicare Other | Admitting: Pharmacist

## 2013-08-04 VITALS — BP 130/72 | HR 55 | Ht 67.0 in | Wt 157.0 lb

## 2013-08-04 DIAGNOSIS — I4891 Unspecified atrial fibrillation: Secondary | ICD-10-CM

## 2013-08-04 DIAGNOSIS — Z954 Presence of other heart-valve replacement: Secondary | ICD-10-CM

## 2013-08-04 DIAGNOSIS — Z7901 Long term (current) use of anticoagulants: Secondary | ICD-10-CM

## 2013-08-04 DIAGNOSIS — Z5181 Encounter for therapeutic drug level monitoring: Secondary | ICD-10-CM

## 2013-08-04 DIAGNOSIS — I359 Nonrheumatic aortic valve disorder, unspecified: Secondary | ICD-10-CM

## 2013-08-04 LAB — POCT INR: INR: 3

## 2013-08-04 NOTE — Assessment & Plan Note (Signed)
Good rate control and anticoagulation  

## 2013-08-04 NOTE — Assessment & Plan Note (Signed)
Normal function continue anticoagulation

## 2013-08-04 NOTE — Patient Instructions (Signed)
Your physician wants you to follow-up in:  6 MONTHS WITH DR NISHAN  You will receive a reminder letter in the mail two months in advance. If you don't receive a letter, please call our office to schedule the follow-up appointment. Your physician recommends that you continue on your current medications as directed. Please refer to the Current Medication list given to you today. 

## 2013-08-04 NOTE — Progress Notes (Signed)
Patient ID: Tammy LandMary L Cyran, female   DOB: 12/14/1945, 68 y.o.   MRN: [id number] This is a 68 year old female patient who has a history of a St. Jude mechanical aortic valve prosthesis placed  Over 18 years ago as well as a mitral valve replacement for rheumatic heart disease. Transthoracic echo does show the presence of significant aortic stenosis without AI, normal function of the mitral prosthesis. LV systolic function had been  normal. The patient also suffers from significant dementia. She has been seen by Dr. Cornelius Moraswen who feels redo aortic valve replacement with root has considerable risk for the possibility of further accelerating her cognitive decline. He recommends following her with periodic transthoracic echoes. If she would develop symptoms of congestive heart failure a decision would need to be made whether or not to consider her for redo aortic valve replacement. She also has chronic atrial fibrillation and is on Coumadin therapy.  Cardiac catheterization performed in March 2014 showed normal coronary arteries, normal right heart pressures.   TEE showed EF of 55-60% mitral valve prosthesis with mild MR prosthetic aortic valve was not visualized turbulence not in the aortic root.  Fluoroscopy the aortic valve appeared to have symmetric opening the leaflets however symmetric obstruction of the aortic valve opening cannot be excluded.   Howver f/u echo done 3/5 now shows EF 35-40% with mean gradient 37mmHg and peak of 61 mmHg  She denies symptoms of CHF      ROS: Denies fever, malais, weight loss, blurry vision, decreased visual acuity, cough, sputum, SOB, hemoptysis, pleuritic pain, palpitaitons, heartburn, abdominal pain, melena, lower extremity edema, claudication, or rash.  All other systems reviewed and negative  General: Affect appropriate Healthy:  appears stated age HEENT: normal Neck supple with no adenopathy JVP normal no bruits no thyromegaly Lungs clear with no wheezing and good  diaphragmatic motion Heart:  S1/click S2 click SEM  no murmur, no rub, gallop or click PMI normal Abdomen: benighn, BS positve, no tenderness, no AAA no bruit.  No HSM or HJR Distal pulses intact with no bruits No edema Neuro non-focal Skin warm and dry No muscular weakness   Current Outpatient Prescriptions  Medication Sig Dispense Refill  . aspirin 81 MG EC tablet Take 81 mg by mouth daily.        Marland Kitchen. b complex vitamins tablet Take 1 tablet by mouth daily.        . Calcium Carbonate (CALCIUM 500 PO) Take 1 tablet by mouth daily.        . Dietary Management Product (AXONA PO) Take 1 packet by mouth daily.      Marland Kitchen. donepezil (ARICEPT) 10 MG tablet Take 10 mg by mouth at bedtime.      . fish oil-omega-3 fatty acids 1000 MG capsule Take 1 g by mouth daily.      . Memantine HCl ER (NAMENDA XR) 28 MG CP24 Take by mouth.      . Multiple Vitamin (MULTIVITAMIN WITH MINERALS) TABS Take 1 tablet by mouth daily.      . vitamin C (ASCORBIC ACID) 500 MG tablet Take 500 mg by mouth 3 (three) times daily.      . vitamin E 400 UNIT capsule Take 400 Units by mouth daily.      Marland Kitchen. warfarin (COUMADIN) 5 MG tablet Take 7.5mg  08/06/12 and 08/07/12, then continue 2.5mg  on M, F, 5mg  on S, T, W, Th, Sat  30 tablet  3   No current facility-administered medications for this visit.  Allergies  Review of patient's allergies indicates no known allergies.  Electrocardiogram:  afib rate 71 minor inferior T wave changes   Assessment and Plan

## 2013-08-04 NOTE — Assessment & Plan Note (Signed)
Concerned about marked drop in EF with known prosthetic valve stenosis.  However her demential is severe and she denies symptoms of CHF  Will ask Dr Cornelius Moras to see regarding any further testing or consideration of surgery.  Patient has little insight Into risk of redo and husband is not keen on repeat surgery

## 2013-08-07 ENCOUNTER — Encounter: Payer: Self-pay | Admitting: Thoracic Surgery (Cardiothoracic Vascular Surgery)

## 2013-08-07 ENCOUNTER — Institutional Professional Consult (permissible substitution) (INDEPENDENT_AMBULATORY_CARE_PROVIDER_SITE_OTHER): Payer: Medicare Other | Admitting: Thoracic Surgery (Cardiothoracic Vascular Surgery)

## 2013-08-07 VITALS — BP 123/74 | HR 63 | Resp 20 | Ht 66.0 in | Wt 155.0 lb

## 2013-08-07 DIAGNOSIS — Z954 Presence of other heart-valve replacement: Secondary | ICD-10-CM

## 2013-08-07 DIAGNOSIS — I4891 Unspecified atrial fibrillation: Secondary | ICD-10-CM

## 2013-08-07 DIAGNOSIS — I350 Nonrheumatic aortic (valve) stenosis: Secondary | ICD-10-CM

## 2013-08-07 DIAGNOSIS — Z952 Presence of prosthetic heart valve: Secondary | ICD-10-CM

## 2013-08-07 DIAGNOSIS — I359 Nonrheumatic aortic valve disorder, unspecified: Secondary | ICD-10-CM

## 2013-08-07 DIAGNOSIS — I482 Chronic atrial fibrillation, unspecified: Secondary | ICD-10-CM | POA: Insufficient documentation

## 2013-08-07 DIAGNOSIS — I099 Rheumatic heart disease, unspecified: Secondary | ICD-10-CM

## 2013-08-07 NOTE — H&P (Signed)
[address]       [place] [address]             [phone]     CARDIOTHORACIC SURGERY CONSULTATION REPORT  Referring Provider is Wendall Stade, MD PCP is Ralene Ok, MD  Chief Complaint  Patient presents with  . Aortic Stenosis    surgical eval for possible re-do of AVR and MVR, Cardiac Cath, TEE 07/29/2012, 2D ECHO 07/30/13      HPI:  Patient is a 68 year old married white female with history of dementia and prosthetic aortic valve dysfunction, status post aortic and mitral valve replacement using St Jude mechanical prosthetic valves in [place] in 1996.  I had the opportunity to evaluate her in consultation in March of 2014 at which time she was hospitalized for thorough evaluation of dysfunction of her aortic prosthesis.  Transthoracic echocardiograms have consistently demonstrated elevated velocities across the aortic valve suggestive of significant recurrent stenosis. However, fluoroscopic imaging demonstrated that both leaflets of the mechanical prosthesis open and close symmetrically and there has not been significant aortic insufficiency.  In addition, at that time the patient has not had did not have any associated symptoms of congestive heart failure. Because of this and the patient's associated history of significant underlying dementia, a conservative, non-operative approach was recommended.  Recent followup transthoracic echocardiogram suggests a significant decrease in left ventricular systolic function. The patient has been referred back for surgical consultation.  The patient lives with her husband who dutifully cares for her on a daily basis. She suffers from dementia which has progressed a bit further over the past year according to her husband.  The two of them continue walk at least a mile every day and the patient has not had any problems with this. The patient's husband reports that she can go up and down a flight of stairs fairly quickly and  she never complained of any shortness of breath.  Both the patient and her husband state that she never gets any shortness of breath with activity. She has not had any chest pain or chest tightness either with activity or at rest. There is no history of dizzy spells, tachypalpitations, nor syncope. The patient has not had any lower extremity edema. The patient's husband reports that he recalls vividly how she appeared when she originally presented with congestive heart failure in 1996, and he notes that she has not had any associated signs or symptoms consistent with congestive heart failure since her heart surgery at that time. However, her underlying dementia has progressed. She still lives at home with her husband who cares for her. She intermittently forgets his name or what she is doing at any given time. She has not wanted off or gotten lost and she for the most part has not had any problems with agitation.  Past Medical History  Diagnosis Date  . Permanent atrial fibrillation   . Hypercholesterolemia   . Long term (current) use of anticoagulants     coumadin therapy  . Aortic valve disorder     a. s/p mech avr in 1986 - [facility];  b. 06/2012 Echo: EF 55-60%, Critical AS, mean grad , Peak grad , mvr functioning well.  . Mitral valve disorder     a. s/p mech mvr in 1986 - [facility];  b. 06/2012 Echo: nl fxnng mvr.  . Dementia   . S/P aortic valve replacement with metallic valve 08/06/1994    St Jude mechanical prosthesis, size  21mm - Dr Pricilla Holm at [facility]  . S/P mitral valve replacement with metallic valve 08/06/1994    St Jude mechanical prosthesis, size 29mm - Dr Pricilla Holm at [facility]  . 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 Severe AS on 07/03/12 TTE b. TEE 07/29/12- EF 55-60%, MV prosthesis with mild MR, mild-mod biatrial enlargement. Prosthetic MV was assessed to be functioning normally (mean gradient of 6 mmHg; mild MR); prosthetic AV not visualized; turbulence not in  aortic root. c. cardiac cath 07/30/12- AV symmetric opening of the leaflets, however symmetric obstruction of the AV opening could not be excluded    Past Surgical History  Procedure Laterality Date  . Aortic valve replacement  08/06/1994    21mm St Jude   . Mitral valve replacement  08/06/1994    29mm St Jude  . Abdominal hysterectomy    . Tee without cardioversion N/A 07/29/2012    Procedure: TRANSESOPHAGEAL ECHOCARDIOGRAM (TEE);  Surgeon: Lewayne Bunting, MD;  Location: [facility] ENDOSCOPY;  Service: Cardiovascular;  Laterality: N/A;  . Cardiac catheterization  07/30/12    Normal cors, normal right heart pressures. By fluoroscopy, the AV appeared to have symmetric opening of the leaflets, however symmetric obstruction of the AV opening could not be excluded     No family history on file.  History   Social History  . Marital Status: Married    Spouse Name: N/A    Number of Children: N/A  . Years of Education: N/A   Occupational History  . Not on file.   Social History Main Topics  . Smoking status: Former Games developer  . Smokeless tobacco: Not on file  . Alcohol Use: No  . Drug Use: No  . Sexual Activity: Not on file   Other Topics Concern  . Not on file   Social History Narrative   Pt walks her [name], [name] on a regular basis    Current Outpatient Prescriptions  Medication Sig Dispense Refill  . aspirin 81 MG EC tablet Take 81 mg by mouth daily.        Marland Kitchen b complex vitamins tablet Take 1 tablet by mouth daily.        . Calcium Carbonate (CALCIUM 500 PO) Take 1 tablet by mouth daily.        . Dietary Management Product (AXONA PO) Take 1 packet by mouth daily.      Marland Kitchen donepezil (ARICEPT) 10 MG tablet Take 10 mg by mouth at bedtime.      . fish oil-omega-3 fatty acids 1000 MG capsule Take 1 g by mouth daily.      . Memantine HCl ER (NAMENDA XR) 28 MG CP24 Take by mouth.      . Multiple Vitamin (MULTIVITAMIN WITH MINERALS) TABS Take 1 tablet by mouth daily.      . vitamin C  (ASCORBIC ACID) 500 MG tablet Take 500 mg by mouth 3 (three) times daily.      . vitamin E 400 UNIT capsule Take 400 Units by mouth daily.      Marland Kitchen warfarin (COUMADIN) 5 MG tablet Take 7.5mg  08/06/12 and 08/07/12, then continue 2.5mg  on M, F, 5mg  on S, T, W, Th, Sat  30 tablet  3   No current facility-administered medications for this visit.    No Known Allergies    Review of Systems:   General:  normal appetite, normal energy, no weight gain, no weight loss, no fever  Cardiac:  no chest pain with  exertion, no chest pain at rest, no SOB with exertion, no resting SOB, no PND, no orthopnea, no palpitations, + arrhythmia, + atrial fibrillation, no LE edema, no dizzy spells, no syncope  Respiratory:  no shortness of breath, no home oxygen, no productive cough, no dry cough, no bronchitis, no wheezing, no hemoptysis, no asthma, no pain with inspiration or cough, no sleep apnea, no CPAP at night  GI:   no difficulty swallowing, no reflux, no frequent heartburn, no hiatal hernia, no abdominal pain, no constipation, no diarrhea, no hematochezia, no hematemesis, no melena  GU:   no dysuria,  no frequency, no urinary tract infection, no hematuria, no kidney stones, no kidney disease  Vascular:  no pain suggestive of claudication, no pain in feet, no leg cramps, no varicose veins, no DVT, no non-healing foot ulcer  Neuro:   no stroke, no TIA's, no seizures, no headaches, no temporary blindness one eye,  no slurred speech, no peripheral neuropathy, no chronic pain, no instability of gait, + significant memory/cognitive dysfunction  Musculoskeletal: no arthritis, no joint swelling, no myalgias, no difficulty walking, normal mobility   Skin:   no rash, no itching, no skin infections, no pressure sores or ulcerations  Psych:   no anxiety, no depression, no nervousness, no unusual recent stress  Eyes:   no blurry vision, no floaters, no recent vision changes, + wears glasses or contacts  ENT:   no hearing loss,  no loose or painful teeth, + dentures  Hematologic:  + easy bruising, no abnormal bleeding, no clotting disorder, no frequent epistaxis  Endocrine:  no diabetes, does not check CBG's at home     Physical Exam:   BP 123/74  Pulse 63  Resp 20  Ht 5\' 6"  (1.676 m)  Wt 155 lb (70.308 kg)  BMI 25.03 kg/m2  SpO2 98%  General:  Somewhat frail-appearing  HEENT:  Unremarkable   Neck:   no JVD, no bruits, no adenopathy   Chest:   clear to auscultation, symmetrical breath sounds, no wheezes, no rhonchi   CV:   Irregular rate and rhythm, mechanical sounds, + systolic murmur   Abdomen:  soft, non-tender, no masses   Extremities:  warm, well-perfused, pulses diminished, no LE edema  Rectal/GU  Deferred  Neuro:   Grossly non-focal and symmetrical throughout  Skin:   Clean and dry, no rashes, no breakdown   Diagnostic Tests:  Transthoracic Echocardiography  Patient: Naria, Abbey MR #: [id number] Study Date: 07/22/2012 Gender: F Age: 42 Height: 165.1cm Weight: 66.7kg BSA: 1.58m^2 Pt. Status: Room:  ORDERING Gweneth Dimitri ATTENDING Olga Millers PERFORMING [facility], [facility] SONOGRAPHER Junious Dresser, RDCS cc:  ------------------------------------------------------------ LV EF: 55% - 60%  ------------------------------------------------------------ Indications: 424.0 Mitral valve disease. 424.1 Aortic valve disorders. 427.31 Atrial Fibrillation.  ------------------------------------------------------------ History: PMH: Acquired from the patient and from the patient's chart. Atrial fibrillation. Aortic valve disease, post prosthetic replacement. Mitral valve disease, post prosthetic replacement. Risk factors: Dyslipidemia.  ------------------------------------------------------------ Study Conclusions  - Left ventricle: The cavity size was normal. Wall thickness was increased in a pattern of mild LVH. Systolic function was normal. The  estimated ejection fraction was in the range of 55% to 60%. Wall motion was normal; there were no regional wall motion abnormalities. - Aortic valve: A mechanical prosthesis was present. There was critical stenosis. Mean gradient: 72mm Hg (S). Peak gradient: Hg (S). - Mitral valve: A mechanical prosthesis was present. Mean gradient: 7mm Hg (D). Peak gradient: 22mm Hg (  D). Valve area by pressure half-time: 2.02cm^2. - Left atrium: The atrium was moderately dilated. - Right atrium: The atrium was moderately dilated. - Pulmonary arteries: Systolic pressure was mildly increased. PA peak pressure: 38mm Hg (S). Impressions:  - MV prosthesis appears to be functioning well; AV prosthesis not well visualized but gradients are severely elevated with mean gradient of 72 mmHg (? valve dysfunction vs mismatch); suggest TEE to further assess.  ------------------------------------------------------------ Labs, prior tests, procedures, and surgery: Echocardiography (2011). EF was 60%. Mitral valve: peak gradient of 21mm Hg and mean gradient of 8mm Hg. Aortic valve: peak gradient of Hg and mean gradient of 51mm Hg.  Valve surgery (1994). Mitral valve replacement with a St. Jude Medical mechanical valve. Aortic valve replacement with a St. Jude Medical mechanical valve. Transthoracic echocardiography. M-mode, complete 2D, spectral Doppler, and color Doppler. Height: Height: 165.1cm. Height: 65in. Weight: Weight: 66.7kg. Weight: 146.7lb. Body mass index: BMI: 24.5kg/m^2. Body surface area: BSA: 1.9m^2. Blood pressure: 160/85. Patient status: Outpatient. Location:  [facility]  ------------------------------------------------------------  ------------------------------------------------------------ Left ventricle: The cavity size was normal. Wall thickness was increased in a pattern of mild LVH. Systolic function was normal. The estimated ejection fraction was in the range of  55% to 60%. Wall motion was normal; there were no regional wall motion abnormalities. The study was not technically sufficient to allow evaluation of LV diastolic dysfunction due to atrial fibrillation.  ------------------------------------------------------------ Aortic valve: A mechanical prosthesis was present. Doppler: There was critical stenosis. Physiologic regurgitation. VTI ratio of LVOT to aortic valve: 0.12. Peak velocity ratio of LVOT to aortic valve: 0.12. Mean gradient: 72mm Hg (S). Peak gradient: Hg (S).  ------------------------------------------------------------ Aorta: Aortic root: The aortic root was normal in size.  ------------------------------------------------------------ Mitral valve: A mechanical prosthesis was present. Doppler: No significant regurgitation. Valve area by pressure half-time: 2.02cm^2. Indexed valve area by pressure half-time: 1.16cm^2/m^2. Mean gradient: 7mm Hg (D). Peak gradient: 22mm Hg (D).  ------------------------------------------------------------ Left atrium: The atrium was moderately dilated.  ------------------------------------------------------------ Right ventricle: The cavity size was normal. Systolic function was normal.  ------------------------------------------------------------ Pulmonic valve: Structurally normal valve. Cusp separation was normal. Doppler: Transvalvular velocity was within the normal range. Mild regurgitation.  ------------------------------------------------------------ Tricuspid valve: Structurally normal valve. Leaflet separation was normal. Doppler: Transvalvular velocity was within the normal range. Mild regurgitation.  ------------------------------------------------------------ Pulmonary artery: Systolic pressure was mildly increased.  ------------------------------------------------------------ Right atrium: The atrium was moderately  dilated.  ------------------------------------------------------------ Pericardium: There was no pericardial effusion.  ------------------------------------------------------------ Systemic veins: Inferior vena cava: The vessel was normal in size; the respirophasic diameter changes were in the normal range (= 50%); findings are consistent with normal central venous pressure.  ------------------------------------------------------------  2D measurements Normal Doppler measurements Norma Left ventricle l LVID ED, 49.3 mm 43-52 Main pulmonary artery chord, Pressure, 38 mm Hg =30 PLAX S LVID ES, 34.3 mm 23-38 LVOT chord, Peak vel, 63. cm/s ----- PLAX S 4 FS, chord, 30 % >29 VTI, S 16. cm ----- PLAX 8 LVPW, ED 12.2 mm ------ Aortic valve IVS/LVPW 0.9 <1.3 Peak vel, 539 cm/s ----- ratio, ED S Ventricular septum Mean vel, 399 cm/s ----- IVS, ED 11 mm ------ S Aorta VTI, S 144 cm ----- Root diam, 26 mm ------ Mean 72 mm Hg ----- ED gradient, Left atrium S AP dim 53 mm ------ Peak 116 mm Hg ----- AP dim 3.05 cm/m^2 <2.2 gradient, index S VTI ratio 0.1 ----- LVOT/AV 2 Peak vel 0.1 ----- ratio, 2 LVOT/AV Mitral valve Mean vel, 113  cm/s ----- D Pressure 109 ms ----- half-time Mean 7 mm Hg ----- gradient, D Peak 22 mm Hg ----- gradient, D Area (PHT) 2.0 cm^2 ----- 2 Area index 1.1 cm^2/m^2 ----- (PHT) 6 Annulus 48. cm ----- VTI 2 Tricuspid valve Regurg 287 cm/s ----- peak vel Peak RV-RA 33 mm Hg ----- gradient, S Systemic veins Estimated 5 mm Hg ----- CVP Right ventricle Pressure, 38 mm Hg <30 S  ------------------------------------------------------------ Prepared and Electronically Authenticated by  Olga Millersrenshaw, Brian 2014-02-25T12:12:36.370    Transesophageal Echocardiography  Patient: Kendrick FriesDealing, Lucretia L MR #: [id number] Study Date: 07/29/2012 Gender: F Age: [age] Height: 165.1cm Weight: 67.3kg BSA: 1.2132m^2 Pt. Status: Room: 2025  ADMITTING  Charlton HawsNishan, Peter ATTENDING Jani GravelNishan, Peter ORDERING Nishan, Peter REFERRING Charlton HawsNishan, Peter PERFORMING Olga MillersCrenshaw, Brian SONOGRAPHER Nolon Rodony Brown, RDCS cc:  ------------------------------------------------------------ LV EF: 55% - 60%  ------------------------------------------------------------ Indications: Aortic stenosis 424.1.  ------------------------------------------------------------ Study Conclusions  - Left ventricle: Systolic function was normal. The estimated ejection fraction was in the range of 55% to 60%. Wall motion was normal; there were no regional wall motion abnormalities. - Mitral valve: A mechanical prosthesis was present. Mild regurgitation. - Left atrium: The atrium was moderately dilated. - Right atrium: The atrium was mildly dilated. - Atrial septum: No defect or patent foramen ovale was identified. - Tricuspid valve: No evidence of vegetation. Recommendations: Prosthetic MV appears to functioning normally (mean gradient of 6 mmHg; mild MR); prosthetic aortic valve not visualized; turbulence not in aortic root. Transesophageal echocardiography. 2D and color Doppler. Height: Height: 165.1cm. Height: 65in. Weight: Weight: 67.3kg. Weight: 148lb. Body mass index: BMI: 24.7kg/m^2. Body surface area: BSA: 1.2732m^2. Blood pressure: 136/82. Patient status: Inpatient. Location: Endoscopy.  ------------------------------------------------------------  ------------------------------------------------------------ Left ventricle: Systolic function was normal. The estimated ejection fraction was in the range of 55% to 60%. Wall motion was normal; there were no regional wall motion abnormalities.  ------------------------------------------------------------ Aortic valve: Poorly visualized.  ------------------------------------------------------------ Aorta: Descending aorta: The descending aorta had mild to moderate diffuse atherosclerotic  disease.  ------------------------------------------------------------ Mitral valve: A mechanical prosthesis was present. Doppler: Mild regurgitation. Valve area by pressure half-time: 3.55cm^2. Indexed valve area by pressure half-time: 2.01cm^2/m^2. Mean gradient: 6mm Hg (D). Peak gradient: 21mm Hg (D).  ------------------------------------------------------------ Left atrium: The atrium was moderately dilated.  ------------------------------------------------------------ Atrial septum: No defect or patent foramen ovale was identified.  ------------------------------------------------------------ Right ventricle: The cavity size was normal. Systolic function was normal.  ------------------------------------------------------------ Pulmonic valve: Doppler: Trivial regurgitation.  ------------------------------------------------------------ Tricuspid valve: Structurally normal valve. Leaflet separation was normal. No evidence of vegetation. Doppler: Mild regurgitation.  ------------------------------------------------------------ Right atrium: The atrium was mildly dilated.  ------------------------------------------------------------ Pericardium: There was no pericardial effusion.  ------------------------------------------------------------  Doppler measurements Norma l Mitral valve Mean vel, 109 cm/s ----- D Pressure 62 ms ----- half-time Mean 6 mm Hg ----- gradient, D Peak 21 mm Hg ----- gradient, D Area 3.5 cm^2 ----- (PHT) 5 Area 2.0 cm^2/m^2 ----- index 1 (PHT) Annulus 35 cm ----- VTI  ------------------------------------------------------------ Prepared and Electronically Authenticated by  Olga Millersrenshaw, Brian 2014-03-04T12:40:38.030   Cardiac Catheterization Procedure Note   Name: Virgia LandMary L Sakuma   MRN: [id number]   DOB: 10/30/1945   Procedure: Right Heart Cath, Left Heart Cath, Selective Coronary Angiography, LV angiography   Indication: MVR/AVR.  Severe aortic valve stenosis by TTE   Procedural Details: The right groin was prepped, draped, and anesthetized with 1% lidocaine. Using the modified Seldinger technique a 4 French sheath was placed in the right femoral artery and a 7 French sheath was placed in the  right femoral vein. A Swan-Ganz catheter was used for the right heart catheterization. Standard protocol was followed for recording of right heart pressures and sampling of oxygen saturations. Fick cardiac output was calculated. Standard Judkins catheters were used for selective coronary angiography. There were no immediate procedural complications. The patient was transferred to the post catheterization recovery area for further monitoring.   Procedural Findings:   Hemodynamics:   RA 10  RV 37/2  PA 33/14 mean 25   PCWP mean 16  LV NA  AO 132/56  Oxygen saturations:   PA 76  AO 99  Cardiac Output (Fick) 5.8 Cardiac Index (Fick) 3.2  Coronary angiography:   Coronary dominance: right   Left mainstem: Normal   Left anterior descending (LAD): Wraps the apex. Normal. D1 moderate sized and normal  Left circumflex (LCx): AV groove vessel large and normal. OM large and normal. PL moderate size and normal   Right coronary artery (RCA): Large and normal. PDA and PL moderate sized an normal.  Left ventriculography: No left ventriculogram. We did not cross with the mechanical AVR. We did multiple fluoroscopy images of the AVR there is symmetric valve opening. However, the degree of opening of both leaflets seems to be reduced.   Final Conclusions: No obstructive CAD. By fluoroscopy the mechanical AVR (bileaflet tilting disc) seems to have symmetric opening of the leaflets. However, we cannot exclude a symmetric obstruction to AV opening. Normal coronaries. Normal right heart pressures.   Recommendations: The case was reviewed with Dr. Eden Emms. We will further review with [facility].   Rollene Rotunda   07/29/2012, 11:53 AM       Transthoracic  Echocardiography  Patient:    Zakeya, Junker MR #:       [id number] Study Date: 07/30/2013 Gender:     F Age:        65 Height:     170.2cm Weight:     68kg BSA:        1.33m^2 Pt. Status: Room:    Layla Maw  REFERRING    [name]  ATTENDING    Nahser, Philip  SONOGRAPHER  McFatter, Angelica Chessman  PERFORMING   [facility], Outpatient cc:  ------------------------------------------------------------ LV EF: 35% -   40%  ------------------------------------------------------------ Indications:      Aortic stenosis 424.1.  Atrial fibrillation - chronic 427.31.  ------------------------------------------------------------ History:   PMH:  Acquired from the patient and from the patient's chart.  Atrial fibrillation.  Aortic valve disease.  Mitral valve disease.  Risk factors:  Former tobacco use.  ------------------------------------------------------------ Study Conclusions  - Left ventricle: The cavity size was normal. Wall thickness   was increased in a pattern of mild LVH. Systolic function   was moderately reduced. The estimated ejection fraction   was in the range of 35% to 40%. Diffuse hypokinesis.   Moderate hypokinesis. - Aortic valve: A mechanical prosthesis was present. Mild   regurgitation. Valve area: 0.39cm^2(VTI). Valve area:   0.39cm^2 (Vmax). - Mitral valve: A mechanical prosthesis was present. Valve   area by continuity equation (using LVOT flow): 0.94cm^2. - Left atrium: The atrium was moderately dilated. - Right atrium: The atrium was moderately to severely   dilated.  ------------------------------------------------------------ Labs, prior tests, procedures, and surgery: Echocardiography (July 22, 2012).     EF was 60%. Mitral valve: peak gradient of 22mm Hg. Aortic valve: peak gradient of Hg.  Valve surgery (March 1996).     Mitral valve replacement with a .  Jude Medical mechanical valve.  Aortic valve replacement with a  . Jude Medical mechanical valve. Transthoracic echocardiography.  M-mode, complete 2D, spectral Doppler, and color Doppler.  Height:  Height: 170.2cm. Height: 67in.  Weight:  Weight: 68kg. Weight: 149.7lb.  Body mass index:  BMI: 23.5kg/m^2.  Body surface area:    BSA: 1.51m^2.  Blood pressure:     110/80.  Patient status:  Outpatient.  Location:  [facility]  ------------------------------------------------------------  ------------------------------------------------------------ Left ventricle:  The cavity size was normal. Wall thickness was increased in a pattern of mild LVH. Systolic function was moderately reduced. The estimated ejection fraction was in the range of 35% to 40%. Diffuse hypokinesis.  Regional wall motion abnormalities:   Moderate hypokinesis. Early diastolic septal annular tissue Doppler velocities Ea were abnormal.  ------------------------------------------------------------ Aortic valve:  The mechanical aortic valve leaflets are not seen well but do not seem to open as well as one would expect. There is trace - mild AI. The measured aortic valve gradient suggests moderate aortic stenosis. A mechanical prosthesis was present.  Doppler:   Mild regurgitation. VTI ratio of LVOT to aortic valve: 0.12. Valve area: 0.39cm^2(VTI). Indexed valve area: 0.22cm^2/m^2 (VTI). Peak velocity ratio of LVOT to aortic valve: 0.13. Valve area: 0.39cm^2 (Vmax). Indexed valve area: 0.22cm^2/m^2 (Vmax). Mean gradient: 37mm Hg (S). Peak gradient: 61mm Hg (S).   ------------------------------------------------------------ Mitral valve:  A mechanical prosthesis was present. Doppler:   No significant regurgitation.    Valve area by pressure half-time: 3.14cm^2. Indexed valve area by pressure half-time: 1.75cm^2/m^2. Valve area by continuity equation (using LVOT flow): 0.94cm^2. Indexed valve area by continuity equation (using LVOT flow): 0.53cm^2/m^2.    Mean gradient:  4mm Hg (D). Peak gradient: 14mm Hg (D).  ------------------------------------------------------------ Left atrium:  The atrium was moderately dilated.  ------------------------------------------------------------ Right ventricle:  The cavity size was normal. Systolic function was normal.  ------------------------------------------------------------ Pulmonic valve:    The valve appears to be grossly normal.  Doppler:   Trivial regurgitation.  ------------------------------------------------------------ Tricuspid valve:   Structurally normal valve.    Doppler: Mild regurgitation.  ------------------------------------------------------------ Right atrium:  The atrium was moderately to severely dilated.  ------------------------------------------------------------ Pericardium:  There was no pericardial effusion.  ------------------------------------------------------------ Systemic veins: Inferior vena cava: The vessel was normal in size; the respirophasic diameter changes were in the normal range (= 50%); findings are consistent with normal central venous pressure.  ------------------------------------------------------------  2D measurements        Normal  Doppler measurements   Normal Left ventricle                 Main pulmonary LVID ED,   44.7 mm     43-52   artery chord,                         Pressure,    30 mm Hg  =30 PLAX                           S LVID ES,   31.4 mm     23-38   Left ventricle chord,                         Ea, lat      10 cm/s   ------ PLAX  ann, tiss FS, chord,   30 %      >29     DP PLAX                           E/Ea, lat  18.4        ------ LVPW, ED   15.8 mm     ------  ann, tiss IVS/LVPW   0.78        <1.3    DP ratio, ED                      Ea, med    6.92 cm/s   ------ Ventricular septum             ann, tiss IVS, ED    12.3 mm     ------  DP LVOT                           E/Ea, med  26.5        ------ Diam, S       20 mm     ------  ann, tiss     9 Area       3.14 cm^2   ------  DP Diam         20 mm     ------  LVOT Aorta                          Peak vel,  48.7 cm/s   ------ Root diam,   27 mm     ------  S ED                             VTI, S       12 cm     ------ Left atrium                    Stroke vol 37.7 ml     ------ AP dim       52 mm     ------  Stroke     21.1 ml/m^2 ------ AP dim     2.91 cm/m^2 <2.2    index index                          Aortic valve                                Peak vel,   389 cm/s   ------                                S                                Mean vel,   282 cm/s   ------                                S  VTI, S     97.1 cm     ------                                Mean         37 mm Hg  ------                                gradient,                                S                                Peak         61 mm Hg  ------                                gradient,                                S                                VTI ratio  0.12        ------                                LVOT/AV                                Area, VTI  0.39 cm^2   ------                                Area index 0.22 cm^2/m ------                                (VTI)           ^2                                Peak vel   0.13        ------                                ratio,                                LVOT/AV                                Area, Vmax 0.39 cm^2   ------  Area index 0.22 cm^2/m ------                                (Vmax)          ^2                                Mitral valve                                Peak E vel  184 cm/s   ------                                Mean vel,    86 cm/s   ------                                D                                Decelerati  196 ms     150-23                                on time                0                                 Pressure     70 ms     ------                                half-time                                Mean          4 mm Hg  ------                                gradient,                                D                                Peak         14 mm Hg  ------                                gradient,                                D  Area (PHT) 3.14 cm^2   ------                                Area index 1.75 cm^2/m ------                                (PHT)           ^2                                Area       0.94 cm^2   ------                                (LVOT)                                continuity                                Area index 0.53 cm^2/m ------                                (LVOT           ^2                                cont)                                Annulus    40.2 cm     ------                                VTI                                Tricuspid valve                                Regurg      261 cm/s   ------                                peak vel                                Peak RV-RA   27 mm Hg  ------                                gradient,                                S  Max regurg  261 cm/s   ------                                vel                                Systemic veins                                Estimated     3 mm Hg  ------                                CVP                                Right ventricle                                Pressure,    30 mm Hg  <30                                S                                Sa vel,    8.87 cm/s   ------                                lat ann,                                tiss DP   ------------------------------------------------------------ Prepared and Electronically Authenticated by  Kristeen Miss [id number]    Impression:  The patient has some degree of  prosthetic valve dysfunction involving [employer] mechanical aortic valve prosthesis placed in 1996 at the time of combined aortic and mitral valve replacement for rheumatic heart disease.  Transthoracic echocardiograms suggest the presence of significant aortic stenosis without aortic regurgitation. Transesophageal echocardiogram confirms normal function of the mitral prosthesis and normal closure of the aortic prosthesis with no significant aortic insufficiency. Left ventricular systolic function may have decreased some over the past year, although I have personally reviewed her most recent echocardiogram and compared it with the transthoracic echocardiogram performed in February 2014.   I feel that the LV systolic function is not as bad as reported and perhaps is only mildly decreased.  The peak velocities across the aortic prosthesis may have dropped slightly, which could suggest a decrease in flow across the valve. The patient remains completely asymptomatic with no signs of congestive heart failure. She remains physically active and walks daily with her husband without any appreciable change in her exercise tolerance.  Most importantly, the patient suffers from significant dementia and this has reportedly progressed over the past year.  Redo aortic valve replacement would come with considerable risk for the possibility of further accelerating her cognitive decline.  Because of this, her husband feels that putting her through surgery would be a  mistake.  I agree with him completely.  In the office today the patient displays very limited ability to understand anything we have discussed.  Although she certainly is at risk for the development of symptoms of congestive heart failure in the future, I do not feel that she should be considered a candidate for redo aortic valve replacement.    Plan:  I recommend continued medical therapy with further adjustments in her medications to be determined by Dr. Eden Emms  and/or Dr. Ludwig Clarks.  She is not currently taking any beta blocker, ACE inhibitor, or diuretics.  The patient's husband understands that there is a possibility that the patient may develop symptomatic congestive heart failure, although he appropriately points out that she doesn't have any symptoms at this time. The patient's husband also understands that there is perhaps a chance that the patient could develop acute congestive heart failure if one of the leaflets of the mechanical valve were to become impinged causing significant aortic insufficiency.  He is comfortable with a long-term plan based upon the assumption that the patient would not likely do well with redo cardiac surgery.   All of his questions been addressed.   I spent in excess of 30 minutes during the conduct of this office consultation and >50% of this time involved direct face-to-face encounter with the patient for counseling and/or coordination of their care.  Salvatore Decent. Cornelius Moras, MD 08/07/2013 1:28 PM

## 2013-09-08 ENCOUNTER — Ambulatory Visit (INDEPENDENT_AMBULATORY_CARE_PROVIDER_SITE_OTHER): Payer: Medicare Other | Admitting: Pharmacist

## 2013-09-08 DIAGNOSIS — I359 Nonrheumatic aortic valve disorder, unspecified: Secondary | ICD-10-CM

## 2013-09-08 DIAGNOSIS — I4891 Unspecified atrial fibrillation: Secondary | ICD-10-CM

## 2013-09-08 DIAGNOSIS — Z5181 Encounter for therapeutic drug level monitoring: Secondary | ICD-10-CM

## 2013-09-08 DIAGNOSIS — Z954 Presence of other heart-valve replacement: Secondary | ICD-10-CM

## 2013-09-08 DIAGNOSIS — Z7901 Long term (current) use of anticoagulants: Secondary | ICD-10-CM

## 2013-09-08 LAB — POCT INR: INR: 2.7

## 2013-10-13 ENCOUNTER — Ambulatory Visit (INDEPENDENT_AMBULATORY_CARE_PROVIDER_SITE_OTHER): Payer: Medicare Other | Admitting: *Deleted

## 2013-10-13 DIAGNOSIS — Z7901 Long term (current) use of anticoagulants: Secondary | ICD-10-CM

## 2013-10-13 DIAGNOSIS — Z5181 Encounter for therapeutic drug level monitoring: Secondary | ICD-10-CM

## 2013-10-13 DIAGNOSIS — I359 Nonrheumatic aortic valve disorder, unspecified: Secondary | ICD-10-CM

## 2013-10-13 DIAGNOSIS — Z954 Presence of other heart-valve replacement: Secondary | ICD-10-CM

## 2013-10-13 DIAGNOSIS — I4891 Unspecified atrial fibrillation: Secondary | ICD-10-CM

## 2013-10-13 LAB — POCT INR: INR: 2.4

## 2013-11-03 ENCOUNTER — Ambulatory Visit (INDEPENDENT_AMBULATORY_CARE_PROVIDER_SITE_OTHER): Payer: Medicare Other | Admitting: Pharmacist

## 2013-11-03 DIAGNOSIS — I359 Nonrheumatic aortic valve disorder, unspecified: Secondary | ICD-10-CM

## 2013-11-03 DIAGNOSIS — Z5181 Encounter for therapeutic drug level monitoring: Secondary | ICD-10-CM

## 2013-11-03 DIAGNOSIS — Z954 Presence of other heart-valve replacement: Secondary | ICD-10-CM

## 2013-11-03 DIAGNOSIS — Z7901 Long term (current) use of anticoagulants: Secondary | ICD-10-CM

## 2013-11-03 DIAGNOSIS — I4891 Unspecified atrial fibrillation: Secondary | ICD-10-CM

## 2013-11-03 LAB — POCT INR: INR: 2.8

## 2013-11-24 ENCOUNTER — Ambulatory Visit (INDEPENDENT_AMBULATORY_CARE_PROVIDER_SITE_OTHER): Payer: Medicare Other | Admitting: Pharmacist

## 2013-11-24 DIAGNOSIS — I4891 Unspecified atrial fibrillation: Secondary | ICD-10-CM

## 2013-11-24 DIAGNOSIS — Z5181 Encounter for therapeutic drug level monitoring: Secondary | ICD-10-CM

## 2013-11-24 DIAGNOSIS — I359 Nonrheumatic aortic valve disorder, unspecified: Secondary | ICD-10-CM

## 2013-11-24 DIAGNOSIS — Z7901 Long term (current) use of anticoagulants: Secondary | ICD-10-CM

## 2013-11-24 DIAGNOSIS — Z954 Presence of other heart-valve replacement: Secondary | ICD-10-CM

## 2013-11-24 LAB — POCT INR: INR: 2.9

## 2013-12-18 IMAGING — CR DG CHEST 2V
2 series · 2 of 2 positions shown · non-contrast
Comparison: None.

CLINICAL DATA: History of CHF

CHEST - 2 VIEW

[x chest ap]
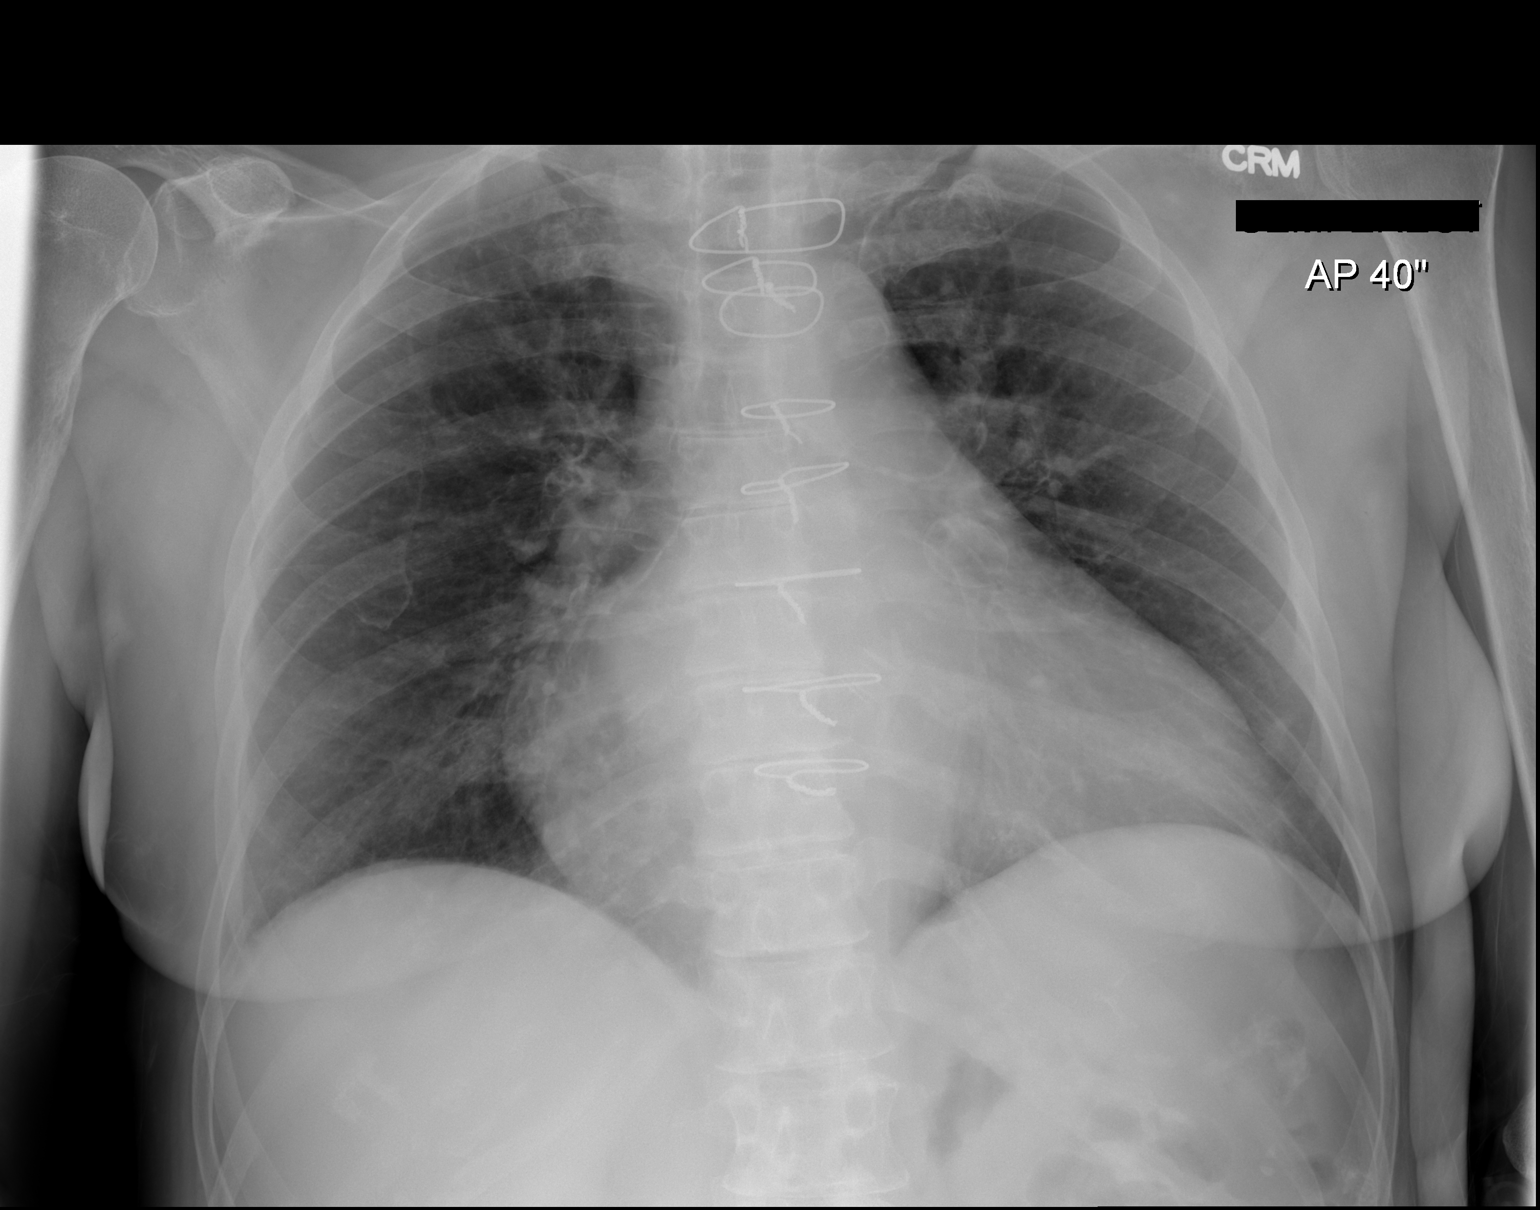

[w chest lat]
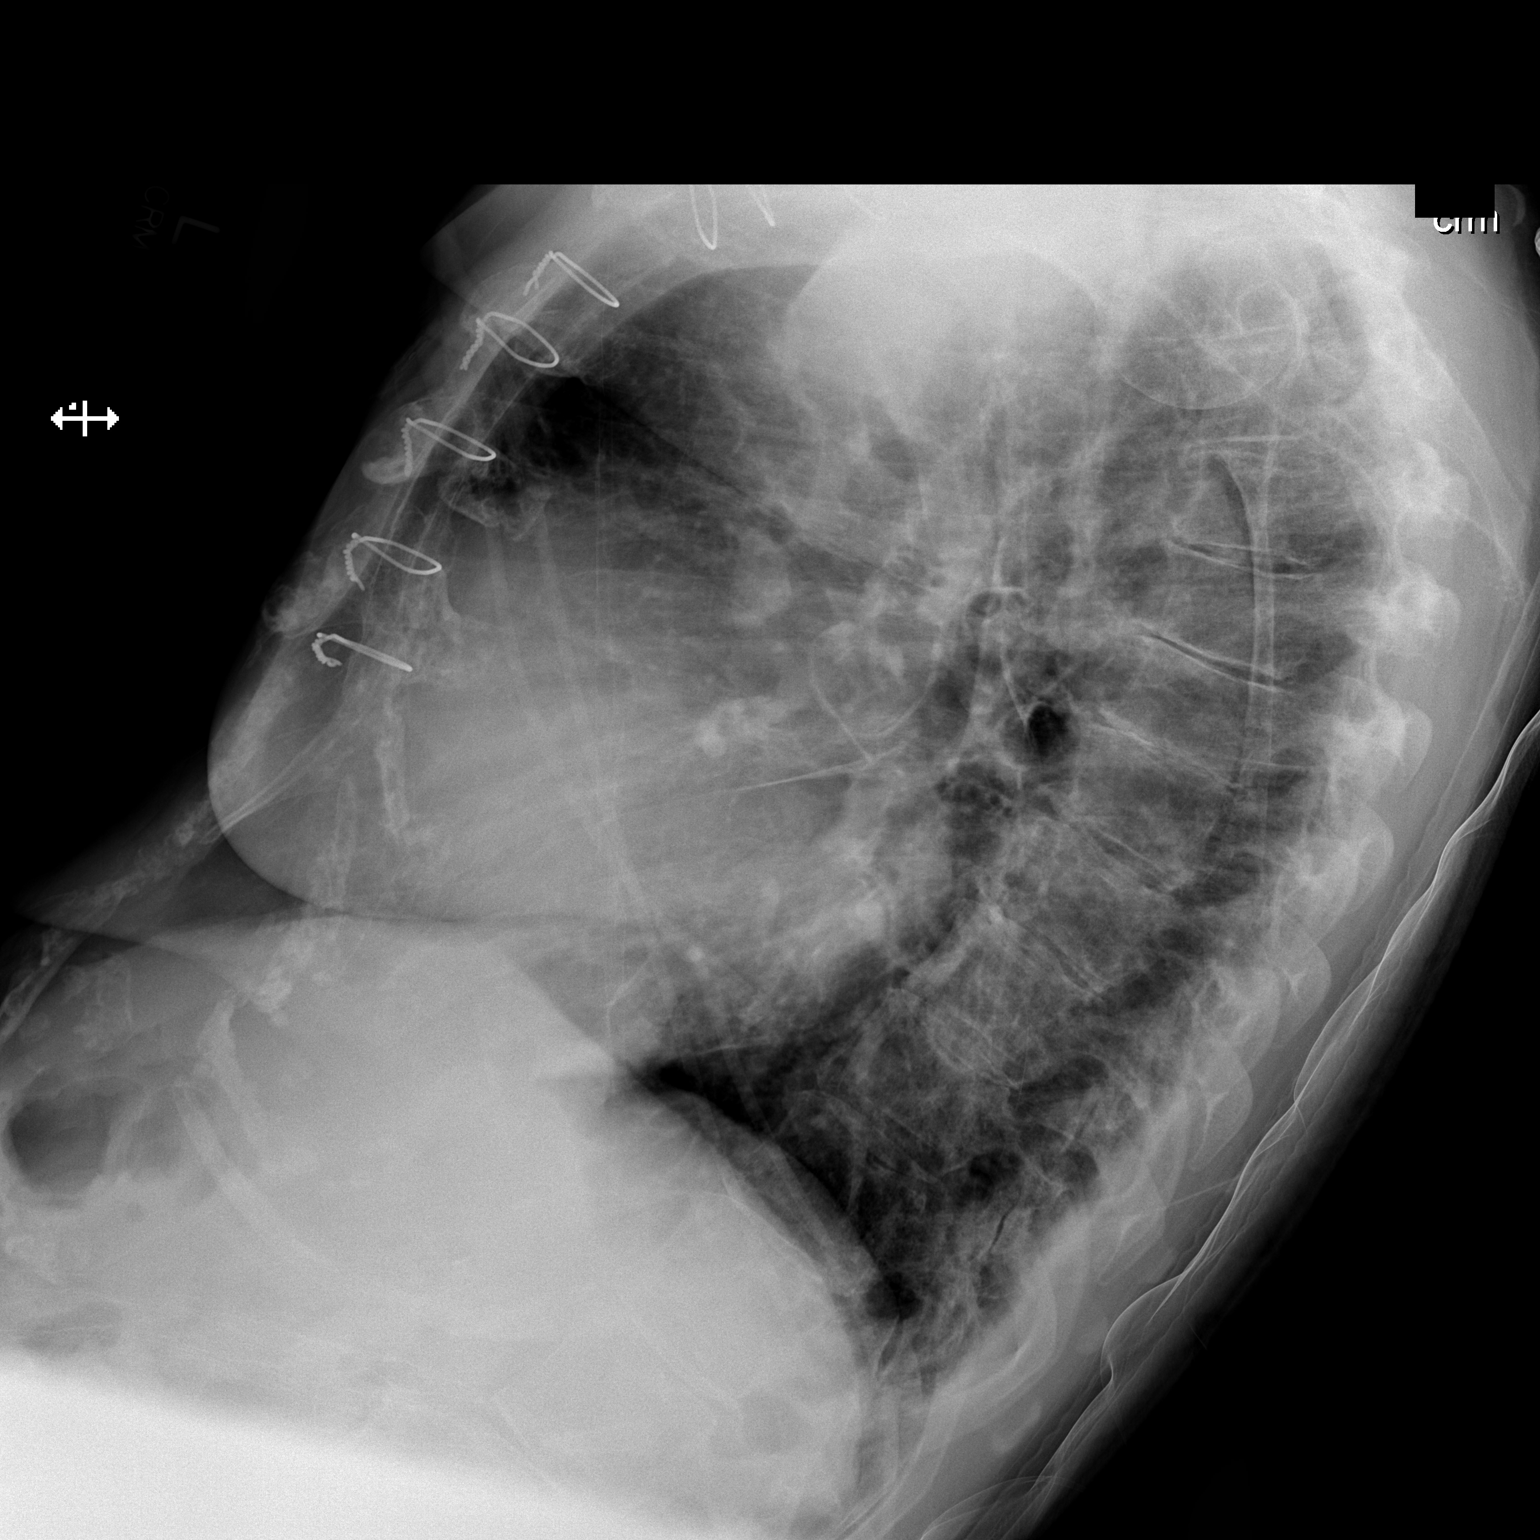

[2 of 2 positions shown; findings below may reference images not displayed]

FINDINGS: Cardiomegaly.  Increased interstitial markings without
frank interstitial edema.  No pleural effusion or pneumothorax.

Degenerative changes of the visualized thoracolumbar spine.
IMPRESSION: Cardiomegaly.  No frank interstitial edema.

## 2013-12-22 ENCOUNTER — Ambulatory Visit (INDEPENDENT_AMBULATORY_CARE_PROVIDER_SITE_OTHER): Payer: Medicare Other | Admitting: *Deleted

## 2013-12-22 DIAGNOSIS — I4891 Unspecified atrial fibrillation: Secondary | ICD-10-CM

## 2013-12-22 DIAGNOSIS — Z954 Presence of other heart-valve replacement: Secondary | ICD-10-CM

## 2013-12-22 DIAGNOSIS — Z5181 Encounter for therapeutic drug level monitoring: Secondary | ICD-10-CM

## 2013-12-22 DIAGNOSIS — Z7901 Long term (current) use of anticoagulants: Secondary | ICD-10-CM

## 2013-12-22 DIAGNOSIS — I359 Nonrheumatic aortic valve disorder, unspecified: Secondary | ICD-10-CM

## 2013-12-22 LAB — POCT INR: INR: 2.7

## 2014-01-19 ENCOUNTER — Ambulatory Visit (INDEPENDENT_AMBULATORY_CARE_PROVIDER_SITE_OTHER): Payer: Medicare Other | Admitting: *Deleted

## 2014-01-19 DIAGNOSIS — Z954 Presence of other heart-valve replacement: Secondary | ICD-10-CM

## 2014-01-19 DIAGNOSIS — I359 Nonrheumatic aortic valve disorder, unspecified: Secondary | ICD-10-CM

## 2014-01-19 DIAGNOSIS — Z5181 Encounter for therapeutic drug level monitoring: Secondary | ICD-10-CM

## 2014-01-19 DIAGNOSIS — I4891 Unspecified atrial fibrillation: Secondary | ICD-10-CM

## 2014-01-19 DIAGNOSIS — Z7901 Long term (current) use of anticoagulants: Secondary | ICD-10-CM

## 2014-01-19 LAB — POCT INR: INR: 3.3

## 2014-02-16 ENCOUNTER — Ambulatory Visit: Payer: Medicare Other | Admitting: Cardiovascular Disease

## 2014-02-19 ENCOUNTER — Ambulatory Visit (INDEPENDENT_AMBULATORY_CARE_PROVIDER_SITE_OTHER): Payer: Medicare Other | Admitting: Cardiology

## 2014-02-19 ENCOUNTER — Ambulatory Visit (INDEPENDENT_AMBULATORY_CARE_PROVIDER_SITE_OTHER): Payer: Medicare Other | Admitting: *Deleted

## 2014-02-19 ENCOUNTER — Encounter: Payer: Self-pay | Admitting: Cardiology

## 2014-02-19 VITALS — BP 120/76 | HR 63 | Ht 65.0 in | Wt 143.0 lb

## 2014-02-19 DIAGNOSIS — I4891 Unspecified atrial fibrillation: Secondary | ICD-10-CM

## 2014-02-19 DIAGNOSIS — I429 Cardiomyopathy, unspecified: Secondary | ICD-10-CM | POA: Insufficient documentation

## 2014-02-19 DIAGNOSIS — I359 Nonrheumatic aortic valve disorder, unspecified: Secondary | ICD-10-CM

## 2014-02-19 DIAGNOSIS — Z5181 Encounter for therapeutic drug level monitoring: Secondary | ICD-10-CM

## 2014-02-19 DIAGNOSIS — I099 Rheumatic heart disease, unspecified: Secondary | ICD-10-CM

## 2014-02-19 DIAGNOSIS — I482 Chronic atrial fibrillation, unspecified: Secondary | ICD-10-CM

## 2014-02-19 DIAGNOSIS — I428 Other cardiomyopathies: Secondary | ICD-10-CM

## 2014-02-19 DIAGNOSIS — Z954 Presence of other heart-valve replacement: Secondary | ICD-10-CM

## 2014-02-19 DIAGNOSIS — Z7901 Long term (current) use of anticoagulants: Secondary | ICD-10-CM

## 2014-02-19 DIAGNOSIS — F0390 Unspecified dementia without behavioral disturbance: Secondary | ICD-10-CM

## 2014-02-19 LAB — POCT INR: INR: 3.6

## 2014-02-19 NOTE — Patient Instructions (Signed)
Your physician recommends that you continue on your current medications as directed. Please refer to the Current Medication list given to you today.  Your physician has requested that you have an echocardiogram. Echocardiography is a painless test that uses sound waves to create images of your heart. It provides your doctor with information about the size and shape of your heart and how well your heart's chambers and valves are working. This procedure takes approximately one hour. There are no restrictions for this procedure. ( In 6 months prior to visit with Dr Eden Emms)  Your physician wants you to follow-up in: 6 months with Dr Haywood Filler will receive a reminder letter in the mail two months in advance. If you don't receive a letter, please call our office to schedule the follow-up appointment.

## 2014-02-19 NOTE — Assessment & Plan Note (Signed)
She is pleasantly demented. She gets around the house without problems.

## 2014-02-19 NOTE — Progress Notes (Signed)
02/19/2014 Tammy Walter   27-Dec-1945  [id number]  Primary Physicia Tammy Ok, MD Primary Cardiologist: Dr Tammy Walter  HPI:  68 y/o female followed by Dr Tammy Walter with a history of AVR and MVR in 1996. She has CAF with CVR on no rate control medications. She has been followed medically. She had a drop in her EF on her last echo in March and she was re evaluated by Dr Tammy Walter. He did not feel her EF was quite as bad as reported. More importantly shew remains asymptomatic. He recommended continued medical Rx. She is in the office today for a 6 month check up. She is able to get around the house and walk with her husband with no limitations.    Current Outpatient Prescriptions  Medication Sig Dispense Refill  . aspirin 81 MG EC tablet Take 81 mg by mouth daily.        Marland Kitchen b complex vitamins tablet Take 1 tablet by mouth daily.        . Calcium Carbonate (CALCIUM 500 PO) Take 1 tablet by mouth daily.        . Dietary Management Product (AXONA PO) Take 1 packet by mouth daily.      Marland Kitchen donepezil (ARICEPT) 10 MG tablet Take 10 mg by mouth at bedtime.      . fish oil-omega-3 fatty acids 1000 MG capsule Take 1 g by mouth daily.      . Memantine HCl ER (NAMENDA XR) 28 MG CP24 Take by mouth.      . Multiple Vitamin (MULTIVITAMIN WITH MINERALS) TABS Take 1 tablet by mouth daily.      . vitamin C (ASCORBIC ACID) 500 MG tablet Take 500 mg by mouth 3 (three) times daily.      . vitamin E 400 UNIT capsule Take 400 Units by mouth daily.      Marland Kitchen warfarin (COUMADIN) 5 MG tablet Take 7.5mg  08/06/12 and 08/07/12, then continue 2.5mg  on M, F,  on S, T, W, Th, Sat  30 tablet  3   No current facility-administered medications for this visit.    No Known Allergies  History   Social History  . Marital Status: Married    Spouse Name: N/A    Number of Children: N/A  . Years of Education: N/A   Occupational History  . Not on file.   Social History Main Topics  . Smoking status: Former Games developer  . Smokeless  tobacco: Not on file  . Alcohol Use: No  . Drug Use: No  . Sexual Activity: Not on file   Other Topics Concern  . Not on file   Social History Narrative   Pt walks her [name], Tammy Walter on a regular basis     Review of Systems: Not obtained secondary to dementia    Blood pressure 120/76, pulse 63, height  (1.651 m), weight 143 lb (64.864 kg).  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 She remains asymptomatic.  Atrial fibrillation CAF- CVR on no medications  Cardiomyopathy She had a drop in her EF on her last echo but remains asymptomatic. She is not a good candidate for re do AVR.  Dementia She is pleasantly demented. She gets around the  house without problems.   PLAN  She is basically on no cardiac medications except ASA and Coumadin. She remains asymptomatic. I discussed symptoms and signs of CHF with her husband. I ordered an echo in 6 months then a follow up with Dr Tammy Walter.   Tammy Walter KPA-C 02/19/2014 8:34 AM

## 2014-02-19 NOTE — Assessment & Plan Note (Signed)
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 She remains asymptomatic.

## 2014-02-19 NOTE — Assessment & Plan Note (Signed)
She had a drop in her EF on her last echo but remains asymptomatic. She is not a good candidate for re do AVR.

## 2014-02-19 NOTE — Assessment & Plan Note (Signed)
CAF- CVR on no medications

## 2014-03-16 ENCOUNTER — Ambulatory Visit (INDEPENDENT_AMBULATORY_CARE_PROVIDER_SITE_OTHER): Payer: Medicare Other | Admitting: *Deleted

## 2014-03-16 DIAGNOSIS — Z5181 Encounter for therapeutic drug level monitoring: Secondary | ICD-10-CM

## 2014-03-16 DIAGNOSIS — Z7901 Long term (current) use of anticoagulants: Secondary | ICD-10-CM

## 2014-03-16 DIAGNOSIS — I4891 Unspecified atrial fibrillation: Secondary | ICD-10-CM

## 2014-03-16 DIAGNOSIS — I359 Nonrheumatic aortic valve disorder, unspecified: Secondary | ICD-10-CM

## 2014-03-16 DIAGNOSIS — Z954 Presence of other heart-valve replacement: Secondary | ICD-10-CM

## 2014-03-16 LAB — POCT INR: INR: 2.6

## 2014-04-13 ENCOUNTER — Ambulatory Visit (INDEPENDENT_AMBULATORY_CARE_PROVIDER_SITE_OTHER): Payer: Medicare Other | Admitting: Pharmacist Clinician (PhC)/ Clinical Pharmacy Specialist

## 2014-04-13 DIAGNOSIS — Z954 Presence of other heart-valve replacement: Secondary | ICD-10-CM

## 2014-04-13 DIAGNOSIS — I359 Nonrheumatic aortic valve disorder, unspecified: Secondary | ICD-10-CM

## 2014-04-13 DIAGNOSIS — Z5181 Encounter for therapeutic drug level monitoring: Secondary | ICD-10-CM

## 2014-04-13 DIAGNOSIS — Z7901 Long term (current) use of anticoagulants: Secondary | ICD-10-CM

## 2014-04-13 DIAGNOSIS — I4891 Unspecified atrial fibrillation: Secondary | ICD-10-CM

## 2014-04-13 LAB — POCT INR: INR: 2.9

## 2014-05-06 ENCOUNTER — Encounter (HOSPITAL_COMMUNITY): Payer: Self-pay | Admitting: Cardiology

## 2014-05-11 ENCOUNTER — Ambulatory Visit (INDEPENDENT_AMBULATORY_CARE_PROVIDER_SITE_OTHER): Payer: Medicare Other | Admitting: Surgery

## 2014-05-11 DIAGNOSIS — I4891 Unspecified atrial fibrillation: Secondary | ICD-10-CM

## 2014-05-11 DIAGNOSIS — Z7901 Long term (current) use of anticoagulants: Secondary | ICD-10-CM

## 2014-05-11 DIAGNOSIS — Z5181 Encounter for therapeutic drug level monitoring: Secondary | ICD-10-CM

## 2014-05-11 DIAGNOSIS — Z954 Presence of other heart-valve replacement: Secondary | ICD-10-CM

## 2014-05-11 DIAGNOSIS — I359 Nonrheumatic aortic valve disorder, unspecified: Secondary | ICD-10-CM

## 2014-05-11 LAB — POCT INR: INR: 2.8

## 2014-06-22 ENCOUNTER — Ambulatory Visit (INDEPENDENT_AMBULATORY_CARE_PROVIDER_SITE_OTHER): Payer: Medicare Other | Admitting: *Deleted

## 2014-06-22 DIAGNOSIS — I4891 Unspecified atrial fibrillation: Secondary | ICD-10-CM

## 2014-06-22 DIAGNOSIS — Z5181 Encounter for therapeutic drug level monitoring: Secondary | ICD-10-CM

## 2014-06-22 DIAGNOSIS — Z7901 Long term (current) use of anticoagulants: Secondary | ICD-10-CM

## 2014-06-22 DIAGNOSIS — Z954 Presence of other heart-valve replacement: Secondary | ICD-10-CM

## 2014-06-22 DIAGNOSIS — I359 Nonrheumatic aortic valve disorder, unspecified: Secondary | ICD-10-CM

## 2014-06-22 LAB — POCT INR: INR: 2.8

## 2014-08-03 ENCOUNTER — Ambulatory Visit (INDEPENDENT_AMBULATORY_CARE_PROVIDER_SITE_OTHER): Payer: Medicare Other | Admitting: Pharmacist Clinician (PhC)/ Clinical Pharmacy Specialist

## 2014-08-03 DIAGNOSIS — I359 Nonrheumatic aortic valve disorder, unspecified: Secondary | ICD-10-CM

## 2014-08-03 DIAGNOSIS — Z5181 Encounter for therapeutic drug level monitoring: Secondary | ICD-10-CM

## 2014-08-03 DIAGNOSIS — Z7901 Long term (current) use of anticoagulants: Secondary | ICD-10-CM

## 2014-08-03 DIAGNOSIS — I4891 Unspecified atrial fibrillation: Secondary | ICD-10-CM

## 2014-08-03 DIAGNOSIS — Z954 Presence of other heart-valve replacement: Secondary | ICD-10-CM

## 2014-08-03 LAB — POCT INR: INR: 2.7

## 2014-09-14 ENCOUNTER — Ambulatory Visit (INDEPENDENT_AMBULATORY_CARE_PROVIDER_SITE_OTHER): Payer: Medicare Other | Admitting: *Deleted

## 2014-09-14 DIAGNOSIS — Z7901 Long term (current) use of anticoagulants: Secondary | ICD-10-CM

## 2014-09-14 DIAGNOSIS — I4891 Unspecified atrial fibrillation: Secondary | ICD-10-CM

## 2014-09-14 DIAGNOSIS — Z5181 Encounter for therapeutic drug level monitoring: Secondary | ICD-10-CM

## 2014-09-14 DIAGNOSIS — I359 Nonrheumatic aortic valve disorder, unspecified: Secondary | ICD-10-CM | POA: Diagnosis not present

## 2014-09-14 DIAGNOSIS — Z954 Presence of other heart-valve replacement: Secondary | ICD-10-CM

## 2014-09-14 LAB — POCT INR: INR: 2.7

## 2014-10-26 ENCOUNTER — Ambulatory Visit (INDEPENDENT_AMBULATORY_CARE_PROVIDER_SITE_OTHER): Payer: Medicare Other | Admitting: *Deleted

## 2014-10-26 DIAGNOSIS — Z5181 Encounter for therapeutic drug level monitoring: Secondary | ICD-10-CM | POA: Diagnosis not present

## 2014-10-26 DIAGNOSIS — Z954 Presence of other heart-valve replacement: Secondary | ICD-10-CM

## 2014-10-26 DIAGNOSIS — I4891 Unspecified atrial fibrillation: Secondary | ICD-10-CM | POA: Diagnosis not present

## 2014-10-26 DIAGNOSIS — Z7901 Long term (current) use of anticoagulants: Secondary | ICD-10-CM | POA: Diagnosis not present

## 2014-10-26 DIAGNOSIS — I359 Nonrheumatic aortic valve disorder, unspecified: Secondary | ICD-10-CM | POA: Diagnosis not present

## 2014-10-26 LAB — POCT INR: INR: 2.6

## 2014-12-07 ENCOUNTER — Ambulatory Visit (INDEPENDENT_AMBULATORY_CARE_PROVIDER_SITE_OTHER): Payer: Medicare Other | Admitting: *Deleted

## 2014-12-07 DIAGNOSIS — Z954 Presence of other heart-valve replacement: Secondary | ICD-10-CM

## 2014-12-07 DIAGNOSIS — Z5181 Encounter for therapeutic drug level monitoring: Secondary | ICD-10-CM | POA: Diagnosis not present

## 2014-12-07 DIAGNOSIS — I359 Nonrheumatic aortic valve disorder, unspecified: Secondary | ICD-10-CM

## 2014-12-07 DIAGNOSIS — Z7901 Long term (current) use of anticoagulants: Secondary | ICD-10-CM

## 2014-12-07 DIAGNOSIS — I4891 Unspecified atrial fibrillation: Secondary | ICD-10-CM | POA: Diagnosis not present

## 2014-12-07 LAB — POCT INR: INR: 3.1

## 2015-01-18 ENCOUNTER — Ambulatory Visit (INDEPENDENT_AMBULATORY_CARE_PROVIDER_SITE_OTHER): Payer: Medicare Other | Admitting: Pharmacist Clinician (PhC)/ Clinical Pharmacy Specialist

## 2015-01-18 DIAGNOSIS — Z5181 Encounter for therapeutic drug level monitoring: Secondary | ICD-10-CM

## 2015-01-18 DIAGNOSIS — Z7901 Long term (current) use of anticoagulants: Secondary | ICD-10-CM | POA: Diagnosis not present

## 2015-01-18 DIAGNOSIS — I359 Nonrheumatic aortic valve disorder, unspecified: Secondary | ICD-10-CM

## 2015-01-18 DIAGNOSIS — Z954 Presence of other heart-valve replacement: Secondary | ICD-10-CM

## 2015-01-18 DIAGNOSIS — I4891 Unspecified atrial fibrillation: Secondary | ICD-10-CM | POA: Diagnosis not present

## 2015-01-18 LAB — POCT INR: INR: 3.1

## 2015-03-04 ENCOUNTER — Ambulatory Visit (INDEPENDENT_AMBULATORY_CARE_PROVIDER_SITE_OTHER): Payer: Medicare Other | Admitting: *Deleted

## 2015-03-04 DIAGNOSIS — I4891 Unspecified atrial fibrillation: Secondary | ICD-10-CM | POA: Diagnosis not present

## 2015-03-04 DIAGNOSIS — Z5181 Encounter for therapeutic drug level monitoring: Secondary | ICD-10-CM | POA: Diagnosis not present

## 2015-03-04 DIAGNOSIS — I359 Nonrheumatic aortic valve disorder, unspecified: Secondary | ICD-10-CM | POA: Diagnosis not present

## 2015-03-04 DIAGNOSIS — Z7901 Long term (current) use of anticoagulants: Secondary | ICD-10-CM | POA: Diagnosis not present

## 2015-03-04 DIAGNOSIS — Z954 Presence of other heart-valve replacement: Secondary | ICD-10-CM

## 2015-03-04 LAB — POCT INR: INR: 3.5

## 2015-04-12 ENCOUNTER — Ambulatory Visit (INDEPENDENT_AMBULATORY_CARE_PROVIDER_SITE_OTHER): Payer: Medicare Other | Admitting: *Deleted

## 2015-04-12 DIAGNOSIS — I4891 Unspecified atrial fibrillation: Secondary | ICD-10-CM | POA: Diagnosis not present

## 2015-04-12 DIAGNOSIS — I359 Nonrheumatic aortic valve disorder, unspecified: Secondary | ICD-10-CM | POA: Diagnosis not present

## 2015-04-12 DIAGNOSIS — Z5181 Encounter for therapeutic drug level monitoring: Secondary | ICD-10-CM | POA: Diagnosis not present

## 2015-04-12 DIAGNOSIS — Z7901 Long term (current) use of anticoagulants: Secondary | ICD-10-CM

## 2015-04-12 DIAGNOSIS — Z954 Presence of other heart-valve replacement: Secondary | ICD-10-CM

## 2015-04-12 LAB — POCT INR: INR: 2.5

## 2015-05-24 ENCOUNTER — Ambulatory Visit (INDEPENDENT_AMBULATORY_CARE_PROVIDER_SITE_OTHER): Payer: Medicare Other | Admitting: *Deleted

## 2015-05-24 DIAGNOSIS — I359 Nonrheumatic aortic valve disorder, unspecified: Secondary | ICD-10-CM

## 2015-05-24 DIAGNOSIS — Z7901 Long term (current) use of anticoagulants: Secondary | ICD-10-CM | POA: Diagnosis not present

## 2015-05-24 DIAGNOSIS — I4891 Unspecified atrial fibrillation: Secondary | ICD-10-CM | POA: Diagnosis not present

## 2015-05-24 DIAGNOSIS — Z954 Presence of other heart-valve replacement: Secondary | ICD-10-CM

## 2015-05-24 DIAGNOSIS — Z5181 Encounter for therapeutic drug level monitoring: Secondary | ICD-10-CM

## 2015-05-24 LAB — POCT INR: INR: 3.1

## 2015-07-05 ENCOUNTER — Ambulatory Visit (INDEPENDENT_AMBULATORY_CARE_PROVIDER_SITE_OTHER): Payer: Medicare Other

## 2015-07-05 DIAGNOSIS — Z7901 Long term (current) use of anticoagulants: Secondary | ICD-10-CM

## 2015-07-05 DIAGNOSIS — I359 Nonrheumatic aortic valve disorder, unspecified: Secondary | ICD-10-CM

## 2015-07-05 DIAGNOSIS — Z5181 Encounter for therapeutic drug level monitoring: Secondary | ICD-10-CM

## 2015-07-05 DIAGNOSIS — I4891 Unspecified atrial fibrillation: Secondary | ICD-10-CM | POA: Diagnosis not present

## 2015-07-05 DIAGNOSIS — Z954 Presence of other heart-valve replacement: Secondary | ICD-10-CM

## 2015-07-05 LAB — POCT INR: INR: 3.4

## 2015-08-16 ENCOUNTER — Ambulatory Visit (INDEPENDENT_AMBULATORY_CARE_PROVIDER_SITE_OTHER): Payer: Medicare Other | Admitting: *Deleted

## 2015-08-16 DIAGNOSIS — I359 Nonrheumatic aortic valve disorder, unspecified: Secondary | ICD-10-CM | POA: Diagnosis not present

## 2015-08-16 DIAGNOSIS — I4891 Unspecified atrial fibrillation: Secondary | ICD-10-CM

## 2015-08-16 DIAGNOSIS — Z954 Presence of other heart-valve replacement: Secondary | ICD-10-CM

## 2015-08-16 DIAGNOSIS — Z5181 Encounter for therapeutic drug level monitoring: Secondary | ICD-10-CM

## 2015-08-16 DIAGNOSIS — Z7901 Long term (current) use of anticoagulants: Secondary | ICD-10-CM

## 2015-08-16 LAB — POCT INR: INR: 3.3

## 2015-09-27 ENCOUNTER — Ambulatory Visit (INDEPENDENT_AMBULATORY_CARE_PROVIDER_SITE_OTHER): Payer: Medicare Other | Admitting: Pharmacist

## 2015-09-27 DIAGNOSIS — Z5181 Encounter for therapeutic drug level monitoring: Secondary | ICD-10-CM

## 2015-09-27 DIAGNOSIS — I4891 Unspecified atrial fibrillation: Secondary | ICD-10-CM | POA: Diagnosis not present

## 2015-09-27 DIAGNOSIS — Z7901 Long term (current) use of anticoagulants: Secondary | ICD-10-CM | POA: Diagnosis not present

## 2015-09-27 DIAGNOSIS — I359 Nonrheumatic aortic valve disorder, unspecified: Secondary | ICD-10-CM

## 2015-09-27 DIAGNOSIS — Z954 Presence of other heart-valve replacement: Secondary | ICD-10-CM

## 2015-09-27 LAB — POCT INR: INR: 3

## 2015-11-08 ENCOUNTER — Ambulatory Visit (INDEPENDENT_AMBULATORY_CARE_PROVIDER_SITE_OTHER): Payer: Medicare Other | Admitting: Pharmacist

## 2015-11-08 DIAGNOSIS — I359 Nonrheumatic aortic valve disorder, unspecified: Secondary | ICD-10-CM

## 2015-11-08 DIAGNOSIS — Z7901 Long term (current) use of anticoagulants: Secondary | ICD-10-CM | POA: Diagnosis not present

## 2015-11-08 DIAGNOSIS — Z5181 Encounter for therapeutic drug level monitoring: Secondary | ICD-10-CM

## 2015-11-08 DIAGNOSIS — Z954 Presence of other heart-valve replacement: Secondary | ICD-10-CM

## 2015-11-08 DIAGNOSIS — I4891 Unspecified atrial fibrillation: Secondary | ICD-10-CM

## 2015-11-08 LAB — POCT INR: INR: 3.3

## 2015-12-20 ENCOUNTER — Encounter (INDEPENDENT_AMBULATORY_CARE_PROVIDER_SITE_OTHER): Payer: Self-pay

## 2015-12-20 ENCOUNTER — Ambulatory Visit (INDEPENDENT_AMBULATORY_CARE_PROVIDER_SITE_OTHER): Payer: Medicare Other

## 2015-12-20 DIAGNOSIS — Z5181 Encounter for therapeutic drug level monitoring: Secondary | ICD-10-CM | POA: Diagnosis not present

## 2015-12-20 DIAGNOSIS — I4891 Unspecified atrial fibrillation: Secondary | ICD-10-CM

## 2015-12-20 DIAGNOSIS — Z7901 Long term (current) use of anticoagulants: Secondary | ICD-10-CM

## 2015-12-20 DIAGNOSIS — I359 Nonrheumatic aortic valve disorder, unspecified: Secondary | ICD-10-CM | POA: Diagnosis not present

## 2015-12-20 DIAGNOSIS — Z954 Presence of other heart-valve replacement: Secondary | ICD-10-CM

## 2015-12-20 LAB — POCT INR: INR: 3.1

## 2016-01-31 ENCOUNTER — Ambulatory Visit (INDEPENDENT_AMBULATORY_CARE_PROVIDER_SITE_OTHER): Payer: Medicare Other

## 2016-01-31 DIAGNOSIS — I359 Nonrheumatic aortic valve disorder, unspecified: Secondary | ICD-10-CM

## 2016-01-31 DIAGNOSIS — Z7901 Long term (current) use of anticoagulants: Secondary | ICD-10-CM

## 2016-01-31 DIAGNOSIS — I4891 Unspecified atrial fibrillation: Secondary | ICD-10-CM | POA: Diagnosis not present

## 2016-01-31 DIAGNOSIS — Z5181 Encounter for therapeutic drug level monitoring: Secondary | ICD-10-CM | POA: Diagnosis not present

## 2016-01-31 DIAGNOSIS — Z954 Presence of other heart-valve replacement: Secondary | ICD-10-CM

## 2016-01-31 LAB — POCT INR: INR: 2.3

## 2016-02-14 ENCOUNTER — Ambulatory Visit: Payer: Self-pay | Admitting: Cardiology

## 2016-02-14 DIAGNOSIS — Z5181 Encounter for therapeutic drug level monitoring: Secondary | ICD-10-CM

## 2016-02-14 DIAGNOSIS — Z954 Presence of other heart-valve replacement: Secondary | ICD-10-CM

## 2018-07-27 DEATH — deceased
